# Patient Record
Sex: Female | Born: 1990 | Race: Black or African American | Hispanic: No | Marital: Single | Smoking: Former smoker
Health system: Southern US, Community
[De-identification: ages and names within clinical notes are randomized; demographics above are authoritative.]

## PROBLEM LIST (undated history)

## (undated) DIAGNOSIS — I10 Essential (primary) hypertension: Secondary | ICD-10-CM

## (undated) DIAGNOSIS — R7303 Prediabetes: Secondary | ICD-10-CM

## (undated) HISTORY — PX: FOOT SURGERY: SHX648

## (undated) HISTORY — PX: JOINT REPLACEMENT: SHX530

## (undated) HISTORY — DX: Essential (primary) hypertension: I10

## (undated) HISTORY — DX: Prediabetes: R73.03

---
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
## 2010-02-18 ENCOUNTER — Emergency Department: Payer: Self-pay | Admitting: Emergency Medicine

## 2010-06-16 ENCOUNTER — Ambulatory Visit: Payer: Self-pay | Admitting: Internal Medicine

## 2010-06-16 ENCOUNTER — Emergency Department: Payer: Self-pay | Admitting: Emergency Medicine

## 2010-06-18 ENCOUNTER — Emergency Department: Payer: Self-pay | Admitting: Emergency Medicine

## 2012-05-30 ENCOUNTER — Emergency Department: Payer: Self-pay | Admitting: Emergency Medicine

## 2012-05-30 LAB — URINALYSIS, COMPLETE
Bilirubin,UR: NEGATIVE
Blood: NEGATIVE
Glucose,UR: NEGATIVE mg/dL (ref 0–75)
Ph: 5 (ref 4.5–8.0)
RBC,UR: <1 /HPF (ref 0–5)
Specific Gravity: 1.03 (ref 1.003–1.030)
WBC UR: 2 /HPF (ref 0–5)

## 2012-11-22 ENCOUNTER — Emergency Department: Payer: Self-pay | Admitting: Emergency Medicine

## 2013-04-05 ENCOUNTER — Emergency Department: Payer: Self-pay | Admitting: Emergency Medicine

## 2013-04-05 LAB — CBC WITH DIFFERENTIAL/PLATELET
Basophil #: 0 10*3/uL (ref 0.0–0.1)
Basophil %: 0.2 %
Eosinophil #: 0 10*3/uL (ref 0.0–0.7)
Eosinophil %: 0 %
HCT: 38.4 % (ref 35.0–47.0)
HGB: 12.9 g/dL (ref 12.0–16.0)
LYMPHS ABS: 0.5 10*3/uL — AB (ref 1.0–3.6)
Lymphocyte %: 6.7 %
MCH: 27.2 pg (ref 26.0–34.0)
MCHC: 33.7 g/dL (ref 32.0–36.0)
MCV: 81 fL (ref 80–100)
Monocyte #: 0.2 x10 3/mm (ref 0.2–0.9)
Monocyte %: 2.9 %
NEUTROS ABS: 6.7 10*3/uL — AB (ref 1.4–6.5)
Neutrophil %: 90.2 %
PLATELETS: 215 10*3/uL (ref 150–440)
RBC: 4.76 10*6/uL (ref 3.80–5.20)
RDW: 15.6 % — AB (ref 11.5–14.5)
WBC: 7.5 10*3/uL (ref 3.6–11.0)

## 2013-04-05 LAB — COMPREHENSIVE METABOLIC PANEL
ALBUMIN: 4 g/dL (ref 3.4–5.0)
ALT: 23 U/L (ref 12–78)
Alkaline Phosphatase: 53 U/L
Anion Gap: 7 (ref 7–16)
BUN: 14 mg/dL (ref 7–18)
Bilirubin,Total: 0.4 mg/dL (ref 0.2–1.0)
CHLORIDE: 105 mmol/L (ref 98–107)
Calcium, Total: 9.4 mg/dL (ref 8.5–10.1)
Co2: 22 mmol/L (ref 21–32)
Creatinine: 0.54 mg/dL — ABNORMAL LOW (ref 0.60–1.30)
Glucose: 94 mg/dL (ref 65–99)
OSMOLALITY: 268 (ref 275–301)
Potassium: 3.8 mmol/L (ref 3.5–5.1)
SGOT(AST): 26 U/L (ref 15–37)
SODIUM: 134 mmol/L — AB (ref 136–145)
TOTAL PROTEIN: 8.5 g/dL — AB (ref 6.4–8.2)

## 2013-04-05 LAB — URINALYSIS, COMPLETE
BILIRUBIN, UR: NEGATIVE
BLOOD: NEGATIVE
Glucose,UR: NEGATIVE mg/dL (ref 0–75)
LEUKOCYTE ESTERASE: NEGATIVE
NITRITE: NEGATIVE
PH: 5 (ref 4.5–8.0)
PROTEIN: NEGATIVE
RBC,UR: 1 /HPF (ref 0–5)
Specific Gravity: 1.029 (ref 1.003–1.030)
Squamous Epithelial: 3
WBC UR: 3 /HPF (ref 0–5)

## 2013-04-05 LAB — LIPASE, BLOOD: LIPASE: 78 U/L (ref 73–393)

## 2013-04-05 LAB — PREGNANCY, URINE: PREGNANCY TEST, URINE: NEGATIVE m[IU]/mL

## 2014-07-01 ENCOUNTER — Emergency Department
Admit: 2014-07-01 | Disposition: A | Discharge status: Home / Self Care | Payer: Self-pay | Admitting: Emergency Medicine

## 2014-09-04 ENCOUNTER — Emergency Department
Admission: EM | Admit: 2014-09-04 | Discharge: 2014-09-04 | Disposition: A | Discharge status: Home / Self Care | Payer: Self-pay | Attending: Emergency Medicine | Admitting: Emergency Medicine

## 2014-09-04 ENCOUNTER — Encounter: Payer: Self-pay | Admitting: Emergency Medicine

## 2014-09-04 ENCOUNTER — Emergency Department
Admission: EM | Admit: 2014-09-04 | Discharge: 2014-09-04 | Discharge status: Against Medical Advice | Payer: Self-pay | Attending: Emergency Medicine | Admitting: Emergency Medicine

## 2014-09-04 DIAGNOSIS — Z720 Tobacco use: Secondary | ICD-10-CM | POA: Insufficient documentation

## 2014-09-04 DIAGNOSIS — M25531 Pain in right wrist: Secondary | ICD-10-CM | POA: Insufficient documentation

## 2014-09-04 DIAGNOSIS — M778 Other enthesopathies, not elsewhere classified: Secondary | ICD-10-CM | POA: Insufficient documentation

## 2014-09-04 MED ORDER — MELOXICAM 15 MG PO TABS: 15.0000 mg | ORAL_TABLET | Freq: Every day | ORAL | Status: DC

## 2014-09-04 NOTE — ED Notes (Signed)
Pt informed RN she has to be somewhere in 2 min. And has to leave.  Has not been seen by MD.

## 2014-09-04 NOTE — ED Notes (Signed)
Says right wrist pain that has worsened over last week.  No injury.  Hurts with movement.

## 2014-09-04 NOTE — ED Notes (Signed)
Pt seen in ER earlier today, left before being seen by provider.

## 2014-09-04 NOTE — Discharge Instructions (Signed)
Follow up with the orthopedic doctor for symptoms that are not improving over the week. Wear the brace while working. Return to the ER for symptoms that change or worsen if you are unable to schedule an appointment.

## 2014-09-04 NOTE — ED Provider Notes (Signed)
[facility] Emergency Department Provider Note ____________________________________________  Time seen: Approximately 6:33 PM  I have reviewed the triage vital signs and the nursing notes.   HISTORY  Chief Complaint Wrist Pain   HPI Kelly Spence is a 24 y.o. female who presents to the emergency department for right wrist pain. She states the pain has worsened over the past week. 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 She has not taken anything over-the-counter for the pain.    No past medical history on file.  There are no active problems to display for this patient.   No past surgical history on file.  Current Outpatient Rx  Name  Route  Sig  Dispense  Refill  . meloxicam (MOBIC) 15 MG tablet   Oral   Take 1 tablet (15 mg total) by mouth daily.   30 tablet   2     Allergies Chocolate and Nickel  No family history on file.  Social History History  Substance Use Topics  . Smoking status: Current Every Day Smoker  . Smokeless tobacco: Not on file  . Alcohol Use: No    Review of Systems Constitutional: No recent illness. Eyes: No visual changes. ENT: No sore throat. Cardiovascular: Denies chest pain or palpitations. Respiratory: Denies shortness of breath. Gastrointestinal: No abdominal pain.  Genitourinary: Negative for dysuria. Musculoskeletal: Pain in right wrist Skin: Negative for rash. Neurological: Negative for headaches, focal weakness or numbness. 10-point ROS otherwise negative.  ____________________________________________   PHYSICAL EXAM:  VITAL SIGNS: ED Triage Vitals  Enc Vitals Group     BP 09/04/14 1600 133/96 mmHg     Pulse Rate 09/04/14 1600 72     Resp --      Temp 09/04/14 1600 98.2 F (36.8 C)     Temp Source 09/04/14 1600 Oral     SpO2 09/04/14 1600 96 %     Weight 09/04/14 1600 237 lb  (107.502 kg)     Height 09/04/14 1600 5\' 8"  (1.727 m)     Head Cir --      Peak Flow --      Pain Score 09/04/14 1600 5     Pain Loc --      Pain Edu? --      Excl. in [place]? --     Constitutional: Alert and oriented. Well appearing and in no acute distress. Eyes: Conjunctivae are normal. EOMI. Head: Atraumatic. Nose: No congestion/rhinnorhea. Neck: No stridor.  Respiratory: Normal respiratory effort.   Musculoskeletal: Full range of motion of right wrist. Mild tenderness with palpation over the lateral right radius. No deformity. Neurologic:  Normal speech and language. No gross focal neurologic deficits are appreciated. Speech is normal. No gait instability. Skin:  Skin is warm, dry and intact. Atraumatic. Psychiatric: Mood and affect are normal. Speech and behavior are normal.  ____________________________________________   LABS (all labs ordered are listed, but only abnormal results are displayed)  Labs Reviewed - No data to display ____________________________________________  RADIOLOGY  Not indicated ____________________________________________   PROCEDURES  Procedure(s) performed: Velcro wrist splint applied by ER tech. Neurovascularly intact post-splint application.   ____________________________________________   INITIAL IMPRESSION / ASSESSMENT AND PLAN / ED COURSE  Pertinent labs & imaging results that were available during my care of the patient were reviewed by me and considered in my medical decision making (see chart for details).  Visit was advised to follow-up with orthopedics for symptoms that are not improving over the week. She was advised to return to the emergency department for symptoms that change or worsen if simple schedule an appointment. ____________________________________________   FINAL CLINICAL IMPRESSION(S) / ED DIAGNOSES  Final diagnoses:  Tendonitis of wrist, right      Victorino Dike, FNP 09/04/14 1853  Harvest Dark,  MD 09/04/14 2308

## 2014-09-04 NOTE — ED Notes (Signed)
Pt complains of right wrist pain, 5/10 pain, pt able to move wrist, pt states she believe it happened at work and hurts when she writes

## 2014-11-14 ENCOUNTER — Emergency Department
Admission: EM | Admit: 2014-11-14 | Discharge: 2014-11-14 | Disposition: A | Discharge status: Home / Self Care | Payer: Self-pay | Attending: Student | Admitting: Student

## 2014-11-14 ENCOUNTER — Encounter: Payer: Self-pay | Admitting: Emergency Medicine

## 2014-11-14 DIAGNOSIS — G44009 Cluster headache syndrome, unspecified, not intractable: Secondary | ICD-10-CM | POA: Insufficient documentation

## 2014-11-14 DIAGNOSIS — Z720 Tobacco use: Secondary | ICD-10-CM | POA: Insufficient documentation

## 2014-11-14 MED ORDER — METOCLOPRAMIDE HCL 10 MG PO TABS
10.0000 mg | ORAL_TABLET | Freq: Once | ORAL | Status: AC
  Administered 2014-11-14: 10 mg via ORAL
  Filled 2014-11-14: qty 1

## 2014-11-14 MED ORDER — NAPROXEN 500 MG PO TBEC: 500.0000 mg | DELAYED_RELEASE_TABLET | Freq: Two times a day (BID) | ORAL | Status: DC

## 2014-11-14 MED ORDER — DIPHENHYDRAMINE HCL 50 MG PO CAPS
50.0000 mg | ORAL_CAPSULE | Freq: Once | ORAL | Status: AC
  Administered 2014-11-14: 50 mg via ORAL
  Filled 2014-11-14: qty 1

## 2014-11-14 MED ORDER — NAPROXEN 500 MG PO TABS
500.0000 mg | ORAL_TABLET | Freq: Once | ORAL | Status: AC
  Administered 2014-11-14: 500 mg via ORAL
  Filled 2014-11-14: qty 1

## 2014-11-14 MED ORDER — CYCLOBENZAPRINE HCL 5 MG PO TABS: 5.0000 mg | ORAL_TABLET | Freq: Three times a day (TID) | ORAL | Status: DC | PRN

## 2014-11-14 NOTE — ED Notes (Signed)
Patient reports headache since last night. Denies any sinus issues. Patient in no acute distress.

## 2014-11-14 NOTE — Discharge Instructions (Signed)
General Headache Without Cause A headache is pain or discomfort felt around the head or neck area. The specific cause of a headache may not be found. There are many causes and types of headaches. A few common ones are:  Tension headaches.  Migraine headaches.  Cluster headaches.  Chronic daily headaches. HOME CARE INSTRUCTIONS   Keep all follow-up appointments with your caregiver or any specialist referral.  Only take over-the-counter or prescription medicines for pain or discomfort as directed by your caregiver.  Lie down in a dark, quiet room when you have a headache.  Keep a headache journal to find out what may trigger your migraine headaches. For example, write down:  What you eat and drink.  How much sleep you get.  Any change to your diet or medicines.  Try massage or other relaxation techniques.  Put ice packs or heat on the head and neck. Use these 3 to 4 times per day for 15 to 20 minutes each time, or as needed.  Limit stress.  Sit up straight, and do not tense your muscles.  Quit smoking if you smoke.  Limit alcohol use.  Decrease the amount of caffeine you drink, or stop drinking caffeine.  Eat and sleep on a regular schedule.  Get 7 to 9 hours of sleep, or as recommended by your caregiver.  Keep lights dim if bright lights bother you and make your headaches worse. SEEK MEDICAL CARE IF:   You have problems with the medicines you were prescribed.  Your medicines are not working.  You have a change from the usual headache.  You have nausea or vomiting. SEEK IMMEDIATE MEDICAL CARE IF:   Your headache becomes severe.  You have a fever.  You have a stiff neck.  You have loss of vision.  You have muscular weakness or loss of muscle control.  You start losing your balance or have trouble walking.  You feel faint or pass out.  You have severe symptoms that are different from your first symptoms. MAKE SURE YOU:   Understand these  instructions.  Will watch your condition.  Will get help right away if you are not doing well or get worse. Document Released: 03/08/2005 Document Revised: 05/31/2011 Document Reviewed: 03/24/2011 [facility] Patient Information 2015 [place], [place]. This information is not intended to replace advice given to you by your health care provider. Make sure you discuss any questions you have with your health care provider.  Take the prescription meds as directed. Follow-up with [facility] Department as needed.

## 2014-11-14 NOTE — ED Notes (Signed)
Pt states HA started last night goes from top of the head to back. Unable to get much sleep last night with HA.

## 2014-11-14 NOTE — ED Provider Notes (Signed)
[facility] Emergency Department Provider Note ____________________________________________  Time seen: 1750  I have reviewed the triage vital signs and the nursing notes.  HISTORY  Chief Complaint  Headache  HPI Kelly Spence is a 24 y.o. female reports to the ED for evaluation and management of her complaint of headache. She describes onset yesterday, while at work. She notes that the headache is across the top of her head and she describes it as tightness. She denies any nausea, vomiting, dizziness, vertigo, or syncope. She dosed any "p.m." type medicine last night at about 7 PM without significant benefit. She reports a pain of to her headache as a 6/10 in triage. She denies any injuries at this time.  History reviewed. No pertinent past medical history.  There are no active problems to display for this patient.  History reviewed. No pertinent past surgical history.  Current Outpatient Rx  Name  Route  Sig  Dispense  Refill  . cyclobenzaprine (FLEXERIL) 5 MG tablet   Oral   Take 1 tablet (5 mg total) by mouth every 8 (eight) hours as needed for muscle spasms.   12 tablet   0   . naproxen (EC NAPROSYN) 500 MG EC tablet   Oral   Take 1 tablet (500 mg total) by mouth 2 (two) times daily with a meal.   30 tablet   0    Allergies Chocolate and Nickel  No family history on file.  Social History Social History  Substance Use Topics  . Smoking status: Current Every Day Smoker  . Smokeless tobacco: None  . Alcohol Use: No   Review of Systems  Constitutional: Negative for fever. Eyes: Negative for visual changes. ENT: Negative for sore throat. Cardiovascular: Negative for chest pain. Respiratory: Negative for shortness of breath. Gastrointestinal: Negative for abdominal pain, vomiting and diarrhea. Genitourinary: Negative for dysuria. Musculoskeletal: Negative for back pain. Skin: Negative for rash. Neurological: Negative for focal  weakness or numbness. Reports headache ____________________________________________  PHYSICAL EXAM:  VITAL SIGNS: ED Triage Vitals  Enc Vitals Group     BP 11/14/14 1734 141/74 mmHg     Pulse Rate 11/14/14 1734 102     Resp 11/14/14 1734 18     Temp 11/14/14 1734 99.5 F (37.5 C)     Temp src --      SpO2 11/14/14 1734 100 %     Weight 11/14/14 1734 238 lb (107.956 kg)     Height 11/14/14 1734 5\' 11"  (1.803 m)     Head Cir --      Peak Flow --      Pain Score 11/14/14 1735 6     Pain Loc --      Pain Edu? --      Excl. in [place]? --    Constitutional: Alert and oriented. Well appearing and in no distress. Eyes: Conjunctivae are normal. PERRL. Normal extraocular movements. Ears: canals clear. TMs intact without bulge, injection, or rupture.    Head: Normocephalic and atraumatic.   Nose: No congestion/rhinnorhea.   Mouth/Throat: Mucous membranes are moist.   Neck: Supple. No thyromegaly. Hematological/Lymphatic/Immunilogical: No cervical lymphadenopathy. Cardiovascular: Normal rate, regular rhythm.  Respiratory: Normal respiratory effort. No wheezes/rales/rhonchi. Gastrointestinal: Soft and nontender. No distention. Musculoskeletal: Nontender with normal range of motion in all extremities.  Neurologic: CN II-XII grossly intact. Normal gait without ataxia. Normal speech and language. No gross focal neurologic deficits are appreciated. Skin:  Skin is warm, dry and intact. No rash noted. Psychiatric:  Mood and affect are normal. Patient exhibits appropriate insight and judgment. ____________________________________________  PROCEDURES  Naprosyn 500 mg PO Reglan 10 mg Po Benadryl 50 mg PO ____________________________________________  INITIAL IMPRESSION / ASSESSMENT AND PLAN / ED COURSE  General headache, not intractable. Consider cluster headache presentation. Will treat with EC Naprosyn and Flexeril.  Follow-up with [facility] Department as needed.   ____________________________________________  FINAL CLINICAL IMPRESSION(S) / ED DIAGNOSES  Final diagnoses:  Cluster headache, not intractable, unspecified chronicity pattern     Melvenia Needles, PA-C 11/14/14 1856  Joanne Gavel, MD 11/15/14 [id number]

## 2015-06-18 LAB — HM PAP SMEAR: HM Pap smear: NEGATIVE

## 2015-11-07 ENCOUNTER — Emergency Department
Admission: EM | Admit: 2015-11-07 | Discharge: 2015-11-07 | Disposition: A | Discharge status: Home / Self Care | Payer: Self-pay | Attending: Emergency Medicine | Admitting: Emergency Medicine

## 2015-11-07 DIAGNOSIS — D1720 Benign lipomatous neoplasm of skin and subcutaneous tissue of unspecified limb: Secondary | ICD-10-CM

## 2015-11-07 DIAGNOSIS — D1779 Benign lipomatous neoplasm of other sites: Secondary | ICD-10-CM | POA: Insufficient documentation

## 2015-11-07 DIAGNOSIS — F17210 Nicotine dependence, cigarettes, uncomplicated: Secondary | ICD-10-CM | POA: Insufficient documentation

## 2015-11-07 NOTE — ED Provider Notes (Signed)
[facility] Emergency Department Provider Note  ____________________________________________  Time seen: Approximately 4:48 PM  I have reviewed the triage vital signs and the nursing notes.   HISTORY  Chief Complaint Abscess (lump underskin posterior left shoulder)    HPI Kelly Spence is a 25 y.o. female who presents emergency department complaining of a "knot" to the left shoulder. Patient states that yesterday her bra strap was pressing on an area of her shoulder that was causing her some pain. When she palpated the area she felt a "knot". Patient was concerned that she may have an abscess to her shoulder. Patient denies any visible swelling, redness, drainage from site. No other complaint at this time. No medications prior to arrival.   History reviewed. No pertinent past medical history.  There are no active problems to display for this patient.   Past Surgical History:  Procedure Laterality Date  . JOINT REPLACEMENT     5th toe, left foot    Prior to Admission medications   Medication Sig Start Date End Date Taking? Authorizing Provider  cyclobenzaprine (FLEXERIL) 5 MG tablet Take 1 tablet (5 mg total) by mouth every 8 (eight) hours as needed for muscle spasms. 11/14/14   Jenise V Bacon Menshew, PA-C  naproxen (EC NAPROSYN) 500 MG EC tablet Take 1 tablet (500 mg total) by mouth 2 (two) times daily with a meal. 11/14/14   Jenise V Bacon Menshew, PA-C    Allergies Chocolate and Nickel  Family History  Problem Relation Age of Onset  . Hypertension Mother   . Hypertension Father   . Seizures Cousin   . Cancer Paternal Grandmother   . Diabetes Maternal Grandmother   . Cancer Paternal Uncle     Social History Social History  Substance Use Topics  . Smoking status: Current Every Day Smoker    Packs/day: 0.50    Types: Cigarettes  . Smokeless tobacco: Never Used  . Alcohol use No     Review of Systems  Constitutional: No  fever/chills Cardiovascular: no chest pain. Respiratory: no cough. No SOB. Gastrointestinal: No abdominal pain.  No nausea, no vomiting.   Musculoskeletal: Negative for musculoskeletal pain. Skin:Positive for "knot" to left shoulder Neurological: Negative for headaches, focal weakness or numbness. 10-point ROS otherwise negative.  ____________________________________________   PHYSICAL EXAM:  VITAL SIGNS: ED Triage Vitals  Enc Vitals Group     BP 11/07/15 1640 130/72     Pulse Rate 11/07/15 1640 88     Resp 11/07/15 1640 18     Temp 11/07/15 1640 98.2 F (36.8 C)     Temp Source 11/07/15 1640 Oral     SpO2 11/07/15 1640 98 %     Weight 11/07/15 1635 235 lb (106.6 kg)     Height 11/07/15 1635 5\' 10"  (1.778 m)     Head Circumference --      Peak Flow --      Pain Score --      Pain Loc --      Pain Edu? --      Excl. in [place]? --      Constitutional: Alert and oriented. Well appearing and in no acute distress. Eyes: Conjunctivae are normal. PERRL. EOMI. Head: Atraumatic. Neck: No stridor.  No cervical spine tenderness to palpation. Hematological/Lymphatic/Immunilogical: No cervical lymphadenopathy. Cardiovascular: Normal rate, regular rhythm. Normal S1 and S2.  Good peripheral circulation. Respiratory: Normal respiratory effort without tachypnea or retractions. Lungs CTAB. Good air entry to the bases with no  decreased or absent breath sounds. Musculoskeletal: Full range of motion to all extremities. No gross deformities appreciated. Neurologic:  Normal speech and language. No gross focal neurologic deficits are appreciated.  Skin:  Skin is warm, dry and intact. No rash noted.Well-circumscribed, mobile, lesion is appreciated by palpation in the subcutaneous layer of the skin. Area is mildly tender palpation. No overlying erythema or edema. No drainage. No fluctuance. No palpable cellulitis. Psychiatric: Mood and affect are normal. Speech and behavior are normal. Patient  exhibits appropriate insight and judgement.   ____________________________________________   LABS (all labs ordered are listed, but only abnormal results are displayed)  Labs Reviewed - No data to display ____________________________________________  EKG   ____________________________________________  RADIOLOGY   No results found.  ____________________________________________    PROCEDURES  Procedure(s) performed:    Procedures    Medications - No data to display   ____________________________________________   INITIAL IMPRESSION / ASSESSMENT AND PLAN / ED COURSE  Pertinent labs & imaging results that were available during my care of the patient were reviewed by me and considered in my medical decision making (see chart for details).  Clinical Course    Patient's diagnosis is consistent with Lipoma to the left shoulder. Exam is reassuring with a well-circumscribed, mobile lesion in the subcutaneous tissue.Marland Kitchen No indication for imaging at this time. No signs of infection.. Patient to take Tylenol and Motrin as needed for symptom relief. Patient will follow-up with surgeon if area continues to be asymptomatic. Patient is given ED precautions to return to the ED for any worsening or new symptoms.     ____________________________________________  FINAL CLINICAL IMPRESSION(S) / ED DIAGNOSES  Final diagnoses:  Lipoma of shoulder      NEW MEDICATIONS STARTED DURING THIS VISIT:  New Prescriptions   No medications on file        This chart was dictated using voice recognition software/Dragon. Despite best efforts to proofread, errors can occur which can change the meaning. Any change was purely unintentional.    Darletta Moll, PA-C 11/07/15 1703    Harvest Dark, MD 11/08/15 [phone]

## 2015-11-07 NOTE — ED Triage Notes (Signed)
PT c/o possible abscess to posterior left shoulder. Pt has a palpable mass to posterior left shoulder. Area is tender to touch. No drainage/warmth/redness noted on exam

## 2015-11-07 NOTE — ED Notes (Signed)
Reviewed d/c instructions, follow-up care with pt. Pt verbalized understanding 

## 2016-12-10 ENCOUNTER — Emergency Department
Admission: EM | Admit: 2016-12-10 | Discharge: 2016-12-10 | Disposition: A | Discharge status: Home / Self Care | Payer: No Typology Code available for payment source | Attending: Student in an Organized Health Care Education/Training Program | Admitting: Student in an Organized Health Care Education/Training Program

## 2016-12-10 ENCOUNTER — Encounter: Payer: Self-pay | Admitting: Emergency Medicine

## 2016-12-10 DIAGNOSIS — F17210 Nicotine dependence, cigarettes, uncomplicated: Secondary | ICD-10-CM | POA: Insufficient documentation

## 2016-12-10 DIAGNOSIS — Y92410 Unspecified street and highway as the place of occurrence of the external cause: Secondary | ICD-10-CM | POA: Diagnosis not present

## 2016-12-10 DIAGNOSIS — S199XXA Unspecified injury of neck, initial encounter: Secondary | ICD-10-CM | POA: Diagnosis present

## 2016-12-10 DIAGNOSIS — Y939 Activity, unspecified: Secondary | ICD-10-CM | POA: Insufficient documentation

## 2016-12-10 DIAGNOSIS — S161XXA Strain of muscle, fascia and tendon at neck level, initial encounter: Secondary | ICD-10-CM | POA: Diagnosis not present

## 2016-12-10 DIAGNOSIS — S80811A Abrasion, right lower leg, initial encounter: Secondary | ICD-10-CM | POA: Insufficient documentation

## 2016-12-10 DIAGNOSIS — Z79899 Other long term (current) drug therapy: Secondary | ICD-10-CM | POA: Diagnosis not present

## 2016-12-10 DIAGNOSIS — Y999 Unspecified external cause status: Secondary | ICD-10-CM | POA: Insufficient documentation

## 2016-12-10 MED ORDER — IBUPROFEN 600 MG PO TABS: 600.0000 mg | ORAL_TABLET | Freq: Four times a day (QID) | ORAL | 0 refills | Status: DC | PRN

## 2016-12-10 MED ORDER — BACITRACIN ZINC 500 UNIT/GM EX OINT
TOPICAL_OINTMENT | CUTANEOUS | Status: AC
  Filled 2016-12-10: qty 0.9

## 2016-12-10 MED ORDER — CYCLOBENZAPRINE HCL 5 MG PO TABS: 5.0000 mg | ORAL_TABLET | Freq: Three times a day (TID) | ORAL | 0 refills | Status: DC | PRN

## 2016-12-10 MED ORDER — BACITRACIN ZINC 500 UNIT/GM EX OINT
TOPICAL_OINTMENT | Freq: Two times a day (BID) | CUTANEOUS | Status: DC
  Administered 2016-12-10: 19:00:00 via TOPICAL
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
## 2016-12-14 ENCOUNTER — Emergency Department
Admission: EM | Admit: 2016-12-14 | Discharge: 2016-12-14 | Disposition: A | Discharge status: Home / Self Care | Payer: No Typology Code available for payment source | Attending: Emergency Medicine | Admitting: Emergency Medicine

## 2016-12-14 ENCOUNTER — Encounter: Payer: Self-pay | Admitting: Emergency Medicine

## 2016-12-14 DIAGNOSIS — S161XXD Strain of muscle, fascia and tendon at neck level, subsequent encounter: Secondary | ICD-10-CM | POA: Diagnosis not present

## 2016-12-14 DIAGNOSIS — F17210 Nicotine dependence, cigarettes, uncomplicated: Secondary | ICD-10-CM | POA: Insufficient documentation

## 2016-12-14 DIAGNOSIS — Z791 Long term (current) use of non-steroidal anti-inflammatories (NSAID): Secondary | ICD-10-CM | POA: Diagnosis not present

## 2016-12-14 DIAGNOSIS — S199XXD Unspecified injury of neck, subsequent encounter: Secondary | ICD-10-CM | POA: Diagnosis present

## 2016-12-14 MED ORDER — IBUPROFEN 800 MG PO TABS: 800.0000 mg | ORAL_TABLET | Freq: Three times a day (TID) | ORAL | 0 refills | Status: DC | PRN

## 2016-12-14 MED ORDER — METHOCARBAMOL 750 MG PO TABS
750.0000 mg | ORAL_TABLET | Freq: Four times a day (QID) | ORAL | 0 refills | Status: DC
Start: 2016-12-14 — End: 2020-06-20
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
## 2016-12-14 NOTE — ED Notes (Signed)
Pt walked from waiting room to flex room with no issues noted

## 2017-03-01 DIAGNOSIS — R030 Elevated blood-pressure reading, without diagnosis of hypertension: Secondary | ICD-10-CM | POA: Insufficient documentation

## 2017-05-18 ENCOUNTER — Encounter: Payer: Self-pay | Admitting: Emergency Medicine

## 2017-05-18 ENCOUNTER — Emergency Department
Admission: EM | Admit: 2017-05-18 | Discharge: 2017-05-18 | Disposition: A | Discharge status: Home / Self Care | Payer: Self-pay | Attending: Student in an Organized Health Care Education/Training Program | Admitting: Student in an Organized Health Care Education/Training Program

## 2017-05-18 DIAGNOSIS — Z79899 Other long term (current) drug therapy: Secondary | ICD-10-CM | POA: Insufficient documentation

## 2017-05-18 DIAGNOSIS — M549 Dorsalgia, unspecified: Secondary | ICD-10-CM | POA: Insufficient documentation

## 2017-05-18 DIAGNOSIS — F17210 Nicotine dependence, cigarettes, uncomplicated: Secondary | ICD-10-CM | POA: Insufficient documentation

## 2017-05-18 LAB — URINALYSIS, COMPLETE (UACMP) WITH MICROSCOPIC
Bilirubin Urine: NEGATIVE
Glucose, UA: NEGATIVE mg/dL
Hgb urine dipstick: NEGATIVE
Ketones, ur: NEGATIVE mg/dL
Leukocytes, UA: NEGATIVE
Nitrite: NEGATIVE
PROTEIN: NEGATIVE mg/dL
SPECIFIC GRAVITY, URINE: 1.02 (ref 1.005–1.030)
pH: 5 (ref 5.0–8.0)

## 2017-05-18 MED ORDER — BACLOFEN 10 MG PO TABS: 10.0000 mg | ORAL_TABLET | Freq: Every day | ORAL | 1 refills | Status: AC

## 2017-05-18 MED ORDER — MELOXICAM 15 MG PO TABS: 15.0000 mg | ORAL_TABLET | Freq: Every day | ORAL | 2 refills | Status: AC

## 2017-05-18 NOTE — ED Provider Notes (Signed)
[facility] Emergency Department Provider Note  ____________________________________________   First MD Initiated Contact with Patient 05/18/17 1655     (approximate)  I have reviewed the triage vital signs and the nursing notes.   HISTORY  Chief Complaint Back Pain    HPI Kelly Spence is a 27 y.o. female presents emergency department complaining of upper mid back pain.  Radiates into the ribs.  States it is hurting deep within.  Does not hurt to touch.  Does not hurt with movement.  She denies any fever, chills, vomiting, diarrhea, or urinary symptoms.  She denies any injury  History reviewed. No pertinent past medical history.  There are no active problems to display for this patient.   Past Surgical History:  Procedure Laterality Date  . JOINT REPLACEMENT     5th toe, left foot    Prior to Admission medications   Medication Sig Start Date End Date Taking? Authorizing Provider  baclofen (LIORESAL) 10 MG tablet Take 1 tablet (10 mg total) by mouth daily. 05/18/17 05/18/18  Cassie Shedlock, Linden Dolin, PA-C  cyclobenzaprine (FLEXERIL) 5 MG tablet Take 1-2 tablets (5-10 mg total) by mouth 3 (three) times daily as needed for muscle spasms. 12/10/16   Duanne Guess, PA-C  ibuprofen (ADVIL,MOTRIN) 800 MG tablet Take 1 tablet (800 mg total) by mouth every 8 (eight) hours as needed for moderate pain. 12/14/16   Sable Feil, PA-C  ibuprofen (IBU) 600 MG tablet Take 1 tablet (600 mg total) by mouth every 6 (six) hours as needed. 12/10/16   Duanne Guess, PA-C  meloxicam (MOBIC) 15 MG tablet Take 1 tablet (15 mg total) by mouth daily. 05/18/17 05/18/18  Caryn Section, Linden Dolin, PA-C  methocarbamol (ROBAXIN-750) 750 MG tablet Take 1 tablet (750 mg total) by mouth 4 (four) times daily. 12/14/16   Sable Feil, PA-C  naproxen (EC NAPROSYN) 500 MG EC tablet Take 1 tablet (500 mg total) by mouth 2 (two) times daily with a meal. 11/14/14   Menshew, Dannielle Karvonen, PA-C     Allergies Chocolate and Nickel  Family History  Problem Relation Age of Onset  . Hypertension Mother   . Hypertension Father   . Seizures Cousin   . Cancer Paternal Grandmother   . Diabetes Maternal Grandmother   . Cancer Paternal Uncle     Social History Social History   Tobacco Use  . Smoking status: Current Every Day Smoker    Packs/day: 0.50    Types: Cigarettes  . Smokeless tobacco: Never Used  Substance Use Topics  . Alcohol use: No  . Drug use: No    Review of Systems  Constitutional: No fever/chills Eyes: No visual changes. ENT: No sore throat. Respiratory: Denies cough Genitourinary: Negative for dysuria. Musculoskeletal: Positive for back pain. Skin: Negative for rash.    ____________________________________________   PHYSICAL EXAM:  VITAL SIGNS: ED Triage Vitals  Enc Vitals Group     BP 05/18/17 1653 (!) 148/93     Pulse Rate 05/18/17 1653 76     Resp 05/18/17 1653 17     Temp 05/18/17 1653 98.8 F (37.1 C)     Temp Source 05/18/17 1653 Oral     SpO2 05/18/17 1653 100 %     Weight 05/18/17 1652 220 lb (99.8 kg)     Height 05/18/17 1652 5\' 10"  (1.778 m)     Head Circumference --      Peak Flow --  Pain Score 05/18/17 1651 6     Pain Loc --      Pain Edu? --      Excl. in [place]? --     Constitutional: Alert and oriented. Well appearing and in no acute distress. Eyes: Conjunctivae are normal.  Head: Atraumatic. Nose: No congestion/rhinnorhea. Mouth/Throat: Mucous membranes are moist.   Cardiovascular: Normal rate, regular rhythm.  Heart sounds are normal Respiratory: Normal respiratory effort.  No retractions, lungs are clear to auscultation GU: deferred Musculoskeletal: FROM all extremities, warm and well perfused, ribs in the back are nontender Neurologic:  Normal speech and language.  Skin:  Skin is warm, dry and intact. No rash noted. Psychiatric: Mood and affect are normal. Speech and behavior are  normal.  ____________________________________________   LABS (all labs ordered are listed, but only abnormal results are displayed)  Labs Reviewed  URINALYSIS, COMPLETE (UACMP) WITH MICROSCOPIC - Abnormal; Notable for the following components:      Result Value   Color, Urine YELLOW (*)    APPearance CLOUDY (*)    Bacteria, UA RARE (*)    Squamous Epithelial / LPF 6-30 (*)    All other components within normal limits   ____________________________________________   ____________________________________________  RADIOLOGY    ____________________________________________   PROCEDURES  Procedure(s) performed: No  Procedures    ____________________________________________   INITIAL IMPRESSION / ASSESSMENT AND PLAN / ED COURSE  Pertinent labs & imaging results that were available during my care of the patient were reviewed by me and considered in my medical decision making (see chart for details).  Patient's 27 year old female complaining of mid back pain.  She denies injury.  Physical exam the area is nontender.  Remainder the exam is benign  UA is normal  Diagnosis is mid back pain.  She was given a prescription for meloxicam 15 mg and baclofen 10 mg 3 times daily.  She is to apply ice to the area.  If she is worsening she is to return the emergency department or see her regular doctor.  She states she understands to comply with instructions.  She was discharged in stable condition     As part of my medical decision making, I reviewed the following data within the [place] notes reviewed and incorporated, Labs reviewed UA is normal, Notes from prior ED visits and [place] Controlled Substance Database  ____________________________________________   FINAL CLINICAL IMPRESSION(S) / ED DIAGNOSES  Final diagnoses:  Mid-back pain, acute      NEW MEDICATIONS STARTED DURING THIS VISIT:  New Prescriptions   BACLOFEN (LIORESAL) 10 MG TABLET     Take 1 tablet (10 mg total) by mouth daily.   MELOXICAM (MOBIC) 15 MG TABLET    Take 1 tablet (15 mg total) by mouth daily.     Note:  This document was prepared using Dragon voice recognition software and may include unintentional dictation errors.    Versie Starks, PA-C 05/18/17 1751    Merlyn Lot, MD 05/18/17 [phone]
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
## 2017-05-18 NOTE — Discharge Instructions (Signed)
Follow-up with your regular doctor if not better in 3-5 days.  Return to emergency department for worsening.  Take medication as prescribed.  Your urinalysis was normal today.  Apply ice to the area that hurts.  This will help with spasms and inflammation.

## 2017-05-18 NOTE — ED Notes (Signed)
Pain over right lateral ribs. Not worse with anything, "it is just there".  No urinary sx.  No fever or cough.

## 2018-03-31 DIAGNOSIS — E669 Obesity, unspecified: Secondary | ICD-10-CM | POA: Insufficient documentation

## 2018-11-01 ENCOUNTER — Emergency Department
Admission: EM | Admit: 2018-11-01 | Discharge: 2018-11-01 | Disposition: A | Discharge status: Home / Self Care | Payer: Self-pay | Attending: Emergency Medicine | Admitting: Emergency Medicine

## 2018-11-01 ENCOUNTER — Other Ambulatory Visit: Payer: Self-pay

## 2018-11-01 ENCOUNTER — Encounter: Payer: Self-pay | Admitting: *Deleted

## 2018-11-01 DIAGNOSIS — I10 Essential (primary) hypertension: Secondary | ICD-10-CM | POA: Insufficient documentation

## 2018-11-01 DIAGNOSIS — Z5321 Procedure and treatment not carried out due to patient leaving prior to being seen by health care provider: Secondary | ICD-10-CM | POA: Insufficient documentation

## 2018-11-01 LAB — BASIC METABOLIC PANEL
Anion gap: 9 (ref 5–15)
BUN: 16 mg/dL (ref 6–20)
CO2: 26 mmol/L (ref 22–32)
Calcium: 9.4 mg/dL (ref 8.9–10.3)
Chloride: 102 mmol/L (ref 98–111)
Creatinine, Ser: 0.69 mg/dL (ref 0.44–1.00)
GFR calc Af Amer: >60 mL/min (ref >60)
GFR calc non Af Amer: >60 mL/min (ref >60)
Glucose, Bld: 81 mg/dL (ref 70–99)
Potassium: 4 mmol/L (ref 3.5–5.1)
Sodium: 137 mmol/L (ref 135–145)

## 2018-11-01 LAB — CBC
HCT: 40.2 % (ref 36.0–46.0)
Hemoglobin: 13.2 g/dL (ref 12.0–15.0)
MCH: 28.3 pg (ref 26.0–34.0)
MCHC: 32.8 g/dL (ref 30.0–36.0)
MCV: 86.1 fL (ref 80.0–100.0)
Platelets: 210 10*3/uL (ref 150–400)
RBC: 4.67 MIL/uL (ref 3.87–5.11)
RDW: 14.6 % (ref 11.5–15.5)
WBC: 7 10*3/uL (ref 4.0–10.5)
nRBC: 0 % (ref 0.0–0.2)

## 2018-11-01 NOTE — ED Triage Notes (Signed)
Pt ambulatory to triage.  Pt reports htn.  Recent headaches and high blood pressure.  No hx of htn.  Pt alert   Speech clear.

## 2018-11-01 NOTE — ED Notes (Signed)
Pt to STAT desk asking about time and lab results. Pt informed that results have come back but there are still several people ahead of her. Pt states she has somewhere to be in 30 min and is unable to stay. pleasant and verbalizes understanding about risks of leaving.

## 2018-11-03 ENCOUNTER — Telehealth: Payer: Self-pay | Admitting: Emergency Medicine

## 2018-11-03 NOTE — Telephone Encounter (Signed)
Called patient due to lwot to inquire about condition and follow up plans.  No answer and no voicemail  

## 2018-11-30 DIAGNOSIS — R030 Elevated blood-pressure reading, without diagnosis of hypertension: Secondary | ICD-10-CM

## 2018-11-30 DIAGNOSIS — E669 Obesity, unspecified: Secondary | ICD-10-CM

## 2018-12-01 ENCOUNTER — Encounter: Payer: Self-pay | Admitting: Family Medicine

## 2018-12-01 ENCOUNTER — Other Ambulatory Visit: Payer: Self-pay

## 2018-12-01 ENCOUNTER — Ambulatory Visit (LOCAL_COMMUNITY_HEALTH_CENTER): Payer: Self-pay | Admitting: Family Medicine

## 2018-12-01 VITALS — BP 151/101 | Ht 70.0 in | Wt 225.8 lb

## 2018-12-01 DIAGNOSIS — Z30013 Encounter for initial prescription of injectable contraceptive: Secondary | ICD-10-CM

## 2018-12-01 DIAGNOSIS — Z720 Tobacco use: Secondary | ICD-10-CM

## 2018-12-01 DIAGNOSIS — Z3009 Encounter for other general counseling and advice on contraception: Secondary | ICD-10-CM

## 2018-12-01 MED ORDER — MEDROXYPROGESTERONE ACETATE 150 MG/ML IM SUSP
150.0000 mg | INTRAMUSCULAR | Status: AC
  Administered 2018-12-01: 150 mg via INTRAMUSCULAR

## 2018-12-01 NOTE — Progress Notes (Signed)
Depo given per Ernestina Patches; tolerated well Aileen Fass, RN

## 2018-12-01 NOTE — Progress Notes (Signed)
Here today to restart Depo. BP initially taken in left arm and reading documented in vitals. Retaken in right arm with a large cuff and found to be 144/97. Hal Morales, RN

## 2018-12-01 NOTE — Progress Notes (Signed)
Family Planning Visit- Repeat Yearly Visit  Subjective:  Kelly Spence is a 28 y.o. being seen today for an well woman visit and to discuss family planning options.    She is currently using none for pregnancy prevention. Patient reports she does not want a pregnancy in the next year. Patient  has Obesity, unspecified; Elevated blood pressure reading without diagnosis of hypertension; and Tobacco use on their problem list.  Chief Complaint  Patient presents with  . Contraception    Depo    Patient reports no changes in history since last visit.  LMP 11/30/2018 and not pregnant.safe to give depo. Missed depo in March due [place]  ER visit  On 11/01/2018 was for HTN and reports BP was 160-190/100. [facility]. They were going to see her via telehealth but she was unsure about this.    Does the patient desire a pregnancy in the next year? (OK[facility] flowsheet)  See flowsheet for other program required questions.   Body mass index is 32.4 kg/m. - Patient is eligible for diabetes screening based on BMI and age >32?  no HA1C ordered? not applicable  Patient reports 1 of partners in last year. Desires STI screening?  No - declines screening  Does the patient have a current or past history of drug use? No   No components found for: HCV]   Health Maintenance Due  Topic Date Due  . HIV Screening  10/01/2005  . TETANUS/TDAP  10/01/2009  . PAP-Cervical Cytology Screening  10/02/2011  . PAP SMEAR-Modifier  10/02/2011  . INFLUENZA VACCINE  10/21/2018    Review of Systems  Constitutional: Negative for chills and fever.  Eyes: Negative for blurred vision and double vision.  Respiratory: Negative for cough and shortness of breath.   Cardiovascular: Negative for chest pain and orthopnea.  Gastrointestinal: Negative for nausea and vomiting.  Genitourinary: Negative for dysuria, flank pain and frequency.  Musculoskeletal: Negative for myalgias.  Skin: Negative for rash.   Neurological: Negative for dizziness, tingling, weakness and headaches.  Endo/Heme/Allergies: Does not bruise/bleed easily.  Psychiatric/Behavioral: Negative for depression and suicidal ideas. The patient is not nervous/anxious.     The following portions of the patient's history were reviewed and updated as appropriate: allergies, current medications, past family history, past medical history, past social history, past surgical history and problem list. Problem list updated.  Objective:   Vitals:   12/01/18 1437  BP: (!) 151/101  Weight: 225 lb 12.8 oz (102.4 kg)  Height: 5\' 10"  (1.778 m)    Physical Exam Vitals signs and nursing note reviewed.  Constitutional:      Appearance: Normal appearance.  HENT:     Head: Normocephalic.  Pulmonary:     Effort: Pulmonary effort is normal.  Musculoskeletal: Normal range of motion.  Skin:    General: Skin is warm and dry.  Neurological:     Mental Status: She is alert. Mental status is at baseline.    Assessment and Plan:  Kelly Spence is a 28 y.o. female presenting to the [facility] Department for an initial well woman exam/family planning visit  #Annual/HCM patient is due for a pap smear based on last pap in 2017. Recommend she make an appt for depo and pap only in 3 months.   #Contraception counseling: Reviewed all forms of birth control options available including abstinence; over the counter/barrier methods; hormonal contraceptive medication including pill, patch, ring, injection,contraceptive implant; hormonal and nonhormonal IUDs; permanent sterilization options including vasectomy and  the various tubal sterilization modalities. Risks and benefits reviewed.  Questions were answered.  Written information was also given to the patient to review.  Patient desires Depo, this was prescribed for patient. She will follow up in 1 yr for surveillance.  She was told to call with any further questions, or with any concerns  about this method of contraception.  Emphasized use of condoms 100% of the time for STI prevention.  #HTN: recommended she follow up with [facility] to establish PCP care and evaluate blood pressure. Patient without sx today but elevated BP. Discussed that depo is safe with HTN but she needs evaluation.   Return in about 3 months (around 03/02/2019) for Depo with pap only.  No future appointments.  Caren Macadam, MD

## 2019-02-14 ENCOUNTER — Ambulatory Visit: Payer: Self-pay

## 2019-03-05 ENCOUNTER — Ambulatory Visit: Payer: Self-pay

## 2019-03-07 ENCOUNTER — Ambulatory Visit: Payer: Self-pay

## 2019-03-12 ENCOUNTER — Other Ambulatory Visit: Payer: Self-pay

## 2019-03-12 ENCOUNTER — Ambulatory Visit (LOCAL_COMMUNITY_HEALTH_CENTER): Payer: Self-pay | Admitting: Physician Assistant

## 2019-03-12 ENCOUNTER — Encounter: Payer: Self-pay | Admitting: Physician Assistant

## 2019-03-12 VITALS — BP 149/98 | Ht 69.0 in | Wt 228.0 lb

## 2019-03-12 DIAGNOSIS — Z01419 Encounter for gynecological examination (general) (routine) without abnormal findings: Secondary | ICD-10-CM

## 2019-03-12 DIAGNOSIS — Z3042 Encounter for surveillance of injectable contraceptive: Secondary | ICD-10-CM

## 2019-03-12 DIAGNOSIS — Z30013 Encounter for initial prescription of injectable contraceptive: Secondary | ICD-10-CM

## 2019-03-12 DIAGNOSIS — Z3009 Encounter for other general counseling and advice on contraception: Secondary | ICD-10-CM

## 2019-03-12 MED ORDER — MEDROXYPROGESTERONE ACETATE 150 MG/ML IM SUSP
150.0000 mg | Freq: Once | INTRAMUSCULAR | Status: AC
Start: 2019-03-12 — End: 2019-03-12
  Administered 2019-03-12: 16:00:00 150 mg via INTRAMUSCULAR

## 2019-03-12 NOTE — Progress Notes (Signed)
[place] problem visit  [place] Department  Subjective:  Kelly Spence is a 28 y.o. being seen today for a pap smear and to continue with Depo.  Chief Complaint  Patient presents with  . Contraception    HPI  Patient states that she was told at the last visit that she would need a pap prior to getting her next Depo.  Reports that she has had vaginal bleeding since 02/15/2019.  Denies vaginal symptoms except for bleeding.  Per chart she is 15 wk 5 d since last Depo.  States that she is bleeding lightly today.  States that she has been thinking about trying to get pregnant in the next year or so and asks about SE after stopping Depo.  Reports that she still has not been evaluated by PCP for elevated BP.  Denies chest pain, blurred vision, headaches, dizziness.   Does the patient have a current or past history of drug use? No   No components found for: HCV]   Health Maintenance Due  Topic Date Due  . HIV Screening  10/01/2005  . TETANUS/TDAP  10/01/2009  . PAP-Cervical Cytology Screening  06/18/2018  . PAP SMEAR-Modifier  06/18/2018  . INFLUENZA VACCINE  10/21/2018    Review of Systems  All other systems reviewed and are negative.   The following portions of the patient's history were reviewed and updated as appropriate: allergies, current medications, past family history, past medical history, past social history, past surgical history and problem list. Problem list updated.   See flowsheet for other program required questions.  Objective:   Vitals:   03/12/19 1509  BP: (!) 149/98  Weight: 228 lb (103.4 kg)  Height: 5\' 9"  (1.753 m)    Physical Exam Vitals reviewed.  Constitutional:      General: She is not in acute distress.    Appearance: Normal appearance.  HENT:     Head: Normocephalic and atraumatic.  Eyes:     Conjunctiva/sclera: Conjunctivae normal.  Pulmonary:     Effort: Pulmonary effort is normal.  Abdominal:   Palpations: Abdomen is soft. There is no mass.     Tenderness: There is no abdominal tenderness. There is no guarding or rebound.  Genitourinary:    General: Normal vulva.     Rectum: Normal.     Comments: External genitalia/pubic area without nits, lice, edema, erythema, lesions and inguinal adenopathy. Vagina with normal mucosa and small amount of menstrual blood present. Cervix without visible lesions. Uterus firm, mobile, nt, no masses, no CMT, no adnexal tenderness or fullness. Skin:    General: Skin is warm and dry.     Findings: No bruising, erythema, lesion or rash.  Neurological:     Mental Status: She is alert and oriented to person, place, and time.  Psychiatric:        Mood and Affect: Mood normal.        Behavior: Behavior normal.        Thought Content: Thought content normal.        Judgment: Judgment normal.       Assessment and Plan:  [name] is a 28 y.o. female presenting to the [facility] Department for a Women's Health problem visit  1. Family planning Counseled patient that she should track cycle and determine when she ovulates once periods are back to being regular once she stops Depo. Counseled that having sex every other day around ovulation can give a good  chance of achieving pregnancy. Enc that she and partner engage in healthy habits such as well balanced diet, regular exercise and sleep, avoid smoking, EtOH and caffeine.  2. Encounter for counseling regarding contraception Counseled that after stopping Depo it can take up to 18 months before periods are regular and this can make it more difficult to get pregnant. Reassured that there is no reason to think she is not able to get pregnant at this point. Stressed to patient importance of continuing with BCM until after BP is under control. - medroxyPROGESTERone (DEPO-PROVERA) injection 150 mg  3. Pap smear, as part of routine gynecological examination Counseled that RN will call or  send letter in 2-3 weeks after results are back. - IGP, rfx Aptima HPV ASCU  4. Surveillance for Depo-Provera contraception OK for Depo 150mg  IM today - medroxyPROGESTERone (DEPO-PROVERA) injection 150 mg     Return in 11 weeks (on 05/28/2019) for Depo.  No future appointments.  Jerene Dilling, PA

## 2019-03-12 NOTE — Progress Notes (Signed)
Here for pap and Depo. Declines bloodwork. Aileen Fass, RN

## 2019-03-13 NOTE — Progress Notes (Signed)
Pt received Depo 150mg  IM per provider order and pt tolerated well. Provider orders completed.Ronny Bacon, RN

## 2019-03-16 LAB — IGP, RFX APTIMA HPV ASCU: PAP Smear Comment: 0

## 2019-03-22 ENCOUNTER — Telehealth: Payer: Self-pay

## 2019-03-27 NOTE — Telephone Encounter (Signed)
TC with patient.  Discussed abn pap and need for colpo. Patient would like referral to [facility]. Aileen Fass, RN

## 2019-03-30 ENCOUNTER — Other Ambulatory Visit: Payer: Self-pay

## 2019-04-03 ENCOUNTER — Ambulatory Visit: Payer: Self-pay | Attending: Oncology

## 2019-04-03 ENCOUNTER — Other Ambulatory Visit: Payer: Self-pay

## 2019-04-03 DIAGNOSIS — Z0000 Encounter for general adult medical examination without abnormal findings: Secondary | ICD-10-CM

## 2019-04-03 NOTE — Progress Notes (Addendum)
[name] for [facility] eligibilit due to COVID 19.  Patient to go directly to Appointment with Dr. Amalia Hailey on 04/19/19 for colposcopy.

## 2019-04-19 ENCOUNTER — Other Ambulatory Visit: Payer: Self-pay

## 2019-04-19 ENCOUNTER — Encounter: Payer: Self-pay | Admitting: Obstetrics and Gynecology

## 2019-04-19 ENCOUNTER — Other Ambulatory Visit (HOSPITAL_COMMUNITY)
Admission: RE | Admit: 2019-04-19 | Discharge: 2019-04-19 | Disposition: A | Discharge status: Home / Self Care | Payer: Self-pay | Source: Ambulatory Visit | Attending: Obstetrics and Gynecology | Admitting: Obstetrics and Gynecology

## 2019-04-19 ENCOUNTER — Ambulatory Visit (INDEPENDENT_AMBULATORY_CARE_PROVIDER_SITE_OTHER): Payer: Self-pay | Admitting: Obstetrics and Gynecology

## 2019-04-19 VITALS — BP 158/103 | HR 81 | Ht 70.0 in | Wt 231.0 lb

## 2019-04-19 DIAGNOSIS — R87612 Low grade squamous intraepithelial lesion on cytologic smear of cervix (LGSIL): Secondary | ICD-10-CM | POA: Insufficient documentation

## 2019-04-19 NOTE — Progress Notes (Signed)
   HPI:  Arizbeth L Collier is a 29 y.o.  G0P0000  who presents today for evaluation and management of abnormal cervical cytology.    Dysplasia History:  LGSIL  she reports this has her first abnormal Pap smear. ROS:    OB History  Gravida Para Term Preterm AB Living  0 0 0 0 0 0  SAB TAB Ectopic Multiple Live Births  0 0 0 0 0    Past Medical History:  Diagnosis Date  . Borderline diabetes    diagnosed in high school    Past Surgical History:  Procedure Laterality Date  . JOINT REPLACEMENT     5th toe, left foot    SOCIAL HISTORY: Social History   Substance and Sexual Activity  Alcohol Use No   Social History   Substance and Sexual Activity  Drug Use No     Family History  Problem Relation Age of Onset  . Hypertension Mother   . Hypertension Father   . Lung disease Father   . Seizures Cousin   . Cancer Paternal Grandmother   . Parkinson's disease Paternal Grandmother   . Diabetes Maternal Grandmother   . Stroke Maternal Grandmother   . Cancer Paternal Uncle   . Aneurysm Maternal Grandfather   . Pancreatic cancer Paternal Grandfather     ALLERGIES:  Chocolate and Nickel  She has a current medication list which includes the following prescription(s): naproxen, cyclobenzaprine, ibuprofen, ibuprofen, and methocarbamol, and the following Facility-Administered Medications: medroxyprogesterone.  Physical Exam: -Vitals:  BP (!) 158/103   Pulse 81   Ht 5\' 10"  (1.778 m)   Wt 231 lb (104.8 kg)   BMI 33.15 kg/m   PROCEDURE: Colposcopy performed with 4% acetic acid after verbal consent obtained                           -Aceto-white Lesions Location(s): None              -Biopsy performed at none              -ECC indicated and performed: Yes.  Cytobrush     -Biopsy sites made hemostatic with pressure and Monsel's solution   -Satisfactory colposcopy: Yes.      -Evidence of Invasive cervical CA :  NO  ASSESSMENT:  Lunette L Scheurich is a 29 y.o. G0P0000  here for  1. Low grade squamous intraepithelial lesion on cytologic smear of cervix (LGSIL)   .  PLAN: 1.  I discussed the grading system of pap smears and HPV high risk viral types.  We will discuss management after colpo results return. 2.  Because no acetowhite changes or abnormalities were noted that colposcopy a Cytobrush ECC was performed and I will do viral typing.  We will discuss management when results return.  No orders of the defined types were placed in this encounter.          F/U  Return for We will contact her with any abnormal test results.  Jeannie Fend ,MD 04/19/2019,11:38 AM

## 2019-04-19 NOTE — Progress Notes (Signed)
Reviewed note from [facility] and patient to follow up re:  colpo results per their recs.

## 2019-04-30 LAB — CYTOLOGY - PAP
Comment: NEGATIVE
Comment: NEGATIVE
Comment: NEGATIVE
HPV 16: NEGATIVE
HPV 18 / 45: NEGATIVE
High risk HPV: POSITIVE — AB

## 2019-05-03 NOTE — Progress Notes (Signed)
Per Dr. Amalia Hailey recommendation, patient to have pap in one year. Jeanella Anton, Patient Care Coordinator to schedule one year follow-up BCCCP appointment followed by appointment with Dr. Amalia Hailey for pap.  Phoned patient with recommendation information.

## 2019-05-15 ENCOUNTER — Ambulatory Visit: Payer: Self-pay | Admitting: Obstetrics and Gynecology

## 2019-05-15 ENCOUNTER — Ambulatory Visit: Payer: Self-pay

## 2020-05-14 ENCOUNTER — Ambulatory Visit: Payer: Self-pay

## 2020-05-14 ENCOUNTER — Ambulatory Visit: Payer: Self-pay | Admitting: Obstetrics and Gynecology

## 2020-05-27 ENCOUNTER — Encounter: Payer: Self-pay | Admitting: Obstetrics and Gynecology

## 2020-06-05 ENCOUNTER — Encounter: Payer: Self-pay | Admitting: Obstetrics and Gynecology

## 2020-06-17 ENCOUNTER — Encounter: Payer: Self-pay | Admitting: Obstetrics and Gynecology

## 2020-06-20 ENCOUNTER — Ambulatory Visit (INDEPENDENT_AMBULATORY_CARE_PROVIDER_SITE_OTHER): Payer: Self-pay | Admitting: Obstetrics and Gynecology

## 2020-06-20 ENCOUNTER — Other Ambulatory Visit: Payer: Self-pay

## 2020-06-20 ENCOUNTER — Encounter: Payer: Self-pay | Admitting: Obstetrics and Gynecology

## 2020-06-20 ENCOUNTER — Other Ambulatory Visit (HOSPITAL_COMMUNITY)
Admission: RE | Admit: 2020-06-20 | Discharge: 2020-06-20 | Disposition: A | Discharge status: Home / Self Care | Payer: Self-pay | Source: Ambulatory Visit | Attending: Obstetrics and Gynecology | Admitting: Obstetrics and Gynecology

## 2020-06-20 VITALS — BP 135/86 | HR 77 | Temp 99.5°F | Resp 16 | Ht 71.0 in | Wt 254.8 lb

## 2020-06-20 DIAGNOSIS — Z01419 Encounter for gynecological examination (general) (routine) without abnormal findings: Secondary | ICD-10-CM

## 2020-06-20 DIAGNOSIS — Z30011 Encounter for initial prescription of contraceptive pills: Secondary | ICD-10-CM

## 2020-06-20 DIAGNOSIS — Z8742 Personal history of other diseases of the female genital tract: Secondary | ICD-10-CM

## 2020-06-20 DIAGNOSIS — A630 Anogenital (venereal) warts: Secondary | ICD-10-CM

## 2020-06-20 DIAGNOSIS — N946 Dysmenorrhea, unspecified: Secondary | ICD-10-CM

## 2020-06-20 DIAGNOSIS — Z3169 Encounter for other general counseling and advice on procreation: Secondary | ICD-10-CM

## 2020-06-20 MED ORDER — DESOGESTREL-ETHINYL ESTRADIOL 0.15-0.02/0.01 MG (21/5) PO TABS: 1.0000 | ORAL_TABLET | Freq: Every day | ORAL | 1 refills | Status: DC

## 2020-06-20 NOTE — Progress Notes (Addendum)
HPI:      [name] is a 30 y.o. G0P0000 who LMP was No LMP recorded. Patient has had an injection.  Subjective:   She presents today for her annual examination.  She has several other issues she would like to discuss at this exam. 1.  She reports that she has noticed some bumps around her posterior vulva/anus which have been present for approximately 6 weeks.  (Patient does smoke cigarettes) 2.  She states she has had unprotected intercourse for greater than 1 year with normal monthly menses without resultant pregnancy.  She says she would like to have a baby in the near future and wants to discuss the possibility of infertility.  She reports her partner has never fathered a child and she has never been pregnant. 3.  History of LG SIL and positive HPV. 4.  She complains of very heavy menstrual bleeding with significant cramping.  She would like to control this in some way.    Hx: The following portions of the patient's history were reviewed and updated as appropriate:             She  has a past medical history of Borderline diabetes. She does not have any pertinent problems on file. She  has a past surgical history that includes Joint replacement. Her family history includes Aneurysm in her maternal grandfather; Cancer in her paternal grandmother and paternal uncle; Diabetes in her maternal grandmother; Hypertension in her father and mother; Lung disease in her father; Pancreatic cancer in her paternal grandfather; Parkinson's disease in her paternal grandmother; Seizures in her cousin; Stroke in her maternal grandmother. She  reports that she has been smoking cigarettes. She has been smoking about 0.10 packs per day. She has never used smokeless tobacco. She reports that she does not drink alcohol and does not use drugs. She has a current medication list which includes the following prescription(s): clobetasol ointment, desogestrel-ethinyl estradiol, and ibuprofen. She is allergic  to chocolate and nickel.       Review of Systems:  Review of Systems  Constitutional: Denied constitutional symptoms, night sweats, recent illness, fatigue, fever, insomnia and weight loss.  Eyes: Denied eye symptoms, eye pain, photophobia, vision change and visual disturbance.  Ears/Nose/Throat/Neck: Denied ear, nose, throat or neck symptoms, hearing loss, nasal discharge, sinus congestion and sore throat.  Cardiovascular: Denied cardiovascular symptoms, arrhythmia, chest pain/pressure, edema, exercise intolerance, orthopnea and palpitations.  Respiratory: Denied pulmonary symptoms, asthma, pleuritic pain, productive sputum, cough, dyspnea and wheezing.  Gastrointestinal: Denied, gastro-esophageal reflux, melena, nausea and vomiting.  Genitourinary: See HPI for additional information.  Musculoskeletal: Denied musculoskeletal symptoms, stiffness, swelling, muscle weakness and myalgia.  Dermatologic: Denied dermatology symptoms, rash and scar.  Neurologic: Denied neurology symptoms, dizziness, headache, neck pain and syncope.  Psychiatric: Denied psychiatric symptoms, anxiety and depression.  Endocrine: Denied endocrine symptoms including hot flashes and night sweats.   Meds:   Current Outpatient Medications on File Prior to Visit  Medication Sig Dispense Refill  . clobetasol ointment (TEMOVATE) 0.05 % Apply topically 2 (two) times daily.    Marland Kitchen ibuprofen (ADVIL,MOTRIN) 800 MG tablet Take 1 tablet (800 mg total) by mouth every 8 (eight) hours as needed for moderate pain. 15 tablet 0   No current facility-administered medications on file prior to visit.       Upstream - 06/20/20 1121      Pregnancy Intention Screening   Does the patient want to become pregnant in the next year? Ok Either [employer]  Does the patient's partner want to become pregnant in the next year? Ok [place]    Would the patient like to discuss contraceptive options today? No          The pregnancy intention  screening data noted above was reviewed. Potential methods of contraception were discussed. The patient elected to proceed with Oral Contraceptive.     Objective:     Vitals:   06/20/20 1118  BP: 135/86  Pulse: 77  Resp: 16  Temp: 99.5 F (37.5 C)    Filed Weights   06/20/20 1118  Weight: 254 lb 12.8 oz (115.6 kg)              Physical examination General NAD, Conversant  HEENT Atraumatic; Op clear with mmm.  Normo-cephalic. Pupils reactive. Anicteric sclerae  Thyroid/Neck Smooth without nodularity or enlargement. Normal ROM.  Neck Supple.  Skin No rashes, lesions or ulceration. Normal palpated skin turgor. No nodularity.  Breasts: No masses or discharge.  Symmetric.  No axillary adenopathy.  Lungs: Clear to auscultation.No rales or wheezes. Normal Respiratory effort, no retractions.  Heart: NSR.  No murmurs or rubs appreciated. No periferal edema  Abdomen: Soft.  Non-tender.  No masses.  No HSM. No hernia  Extremities: Moves all appropriately.  Normal ROM for age. No lymphadenopathy.  Neuro: Oriented to PPT.  Normal mood. Normal affect.     Pelvic:   Vulva: Normal appearance.  Multiple small condyloma noted  Vagina: No lesions or abnormalities noted.  Support: Normal pelvic support.  Urethra No masses tenderness or scarring.  Meatus Normal size without lesions or prolapse.  Cervix: Normal appearance.  No lesions.  Anus: Normal exam.  No lesions.  Perineum: Normal exam.  No lesions.        Bimanual   Uterus: Normal size.  Non-tender.  Mobile.  AV.  Adnexae: No masses.  Non-tender to palpation.  Cul-de-sac: Negative for abnormality.      Assessment:    G0P0000 Patient Active Problem List   Diagnosis Date Noted  . Tobacco use 12/01/2018  . Obesity, unspecified 03/31/2018  . Elevated blood pressure reading without diagnosis of hypertension 03/01/2017     1. Well woman exam with routine gynecological exam   2. Condyloma acuminatum of vulva   3. History of  abnormal cervical Pap smear   4. Dysmenorrhea   5. Infertility counseling   6. Initiation of OCP (BCP)     At this time she states she does not want immediate pregnancy.  She would rather have cycle control at this time.   Plan:            1.  Basic Screening Recommendations The basic screening recommendations for asymptomatic women were discussed with the patient during her visit.  The age-appropriate recommendations were discussed with her and the rational for the tests reviewed.  When I am informed by the patient that another primary care physician has previously obtained the age-appropriate tests and they are up-to-date, only outstanding tests are ordered and referrals given as necessary.  Abnormal results of tests will be discussed with her when all of her results are completed.  Routine preventative health maintenance measures emphasized: Exercise/Diet/Weight control, Tobacco Warnings, Alcohol/Substance use risks and Stress Management Pap performed despite heavy menstrual bleeding-if Pap limited by bleeding recommend repeat in 3 months at [facility] follow-up visit. 2.  Discussed infertility work-up and testing.  She had numerous questions regarding timing of intercourse as well as female infertility. (He believes he may  not be able to conceive)  3.  Multiple ways of controlling dysmenorrhea discussed in detail.  Patient is chosen to start OCPs.   OCPs The risks /benefits of OCPs have been explained to the patient in detail.  Product literature has been given to her where appropriate.  I have instructed her in the use of OCPs.  I have explained to the patient that OCPs are not as effective for birth control during the first month of use, and that another form of contraception should be used during this time.  Both first-day start and Sunday start have been explained.  The risks and benefits of each was discussed.  She has been made aware of  the fact that in rare circumstances, other medications may  affect the efficacy of OCPs.  I have answered all of her questions, and I believe that she has an understanding of the effectiveness and use of OCPs. 4.  Advised patient to discontinue tobacco use. 5.  Multiple small new onset vulvar condyloma discussed.  HPV virus discussed.  Recommend patient discontinue tobacco products for at least 6 months prior to treatment for vulvar condyloma. Orders No orders of the defined types were placed in this encounter.    Meds ordered this encounter  Medications  . desogestrel-ethinyl estradiol (MIRCETTE) 0.15-0.02/0.01 MG (21/5) tablet    Sig: Take 1 tablet by mouth at bedtime.    Dispense:  84 tablet    Refill:  1          F/U  Return in about 3 months (around 09/19/2020). I spent 24 additional minutes involved in the care of this patient preparing to see the patient by obtaining and reviewing her medical history (including labs, imaging tests and prior procedures), documenting clinical information in the electronic health record (EHR), counseling and coordinating care plans, writing and sending prescriptions, ordering tests or procedures and directly communicating with the patient by discussing pertinent items from her history and physical exam as well as detailing my assessment and plan as noted above so that she has an informed understanding.  See #234 and 5 above as additional time spent on topics not typically associated with well woman exam.  All of her questions were answered.  Finis Bud, M.D. 06/20/2020 12:08 PM

## 2020-06-20 NOTE — Addendum Note (Signed)
Addended by: Edwyna Shell on: 06/20/2020 12:23 PM   Modules accepted: Orders

## 2020-06-26 LAB — CYTOLOGY - PAP
Comment: NEGATIVE
High risk HPV: POSITIVE — AB

## 2020-07-10 ENCOUNTER — Other Ambulatory Visit (HOSPITAL_COMMUNITY)
Admission: RE | Admit: 2020-07-10 | Discharge: 2020-07-10 | Disposition: A | Discharge status: Home / Self Care | Payer: Commercial Managed Care - PPO | Source: Ambulatory Visit | Attending: Obstetrics and Gynecology | Admitting: Obstetrics and Gynecology

## 2020-07-10 ENCOUNTER — Ambulatory Visit (INDEPENDENT_AMBULATORY_CARE_PROVIDER_SITE_OTHER): Payer: Commercial Managed Care - PPO | Admitting: Obstetrics and Gynecology

## 2020-07-10 ENCOUNTER — Other Ambulatory Visit: Payer: Self-pay

## 2020-07-10 ENCOUNTER — Encounter: Payer: Self-pay | Admitting: Obstetrics and Gynecology

## 2020-07-10 VITALS — BP 147/94 | HR 80 | Ht 70.0 in | Wt 251.1 lb

## 2020-07-10 DIAGNOSIS — B977 Papillomavirus as the cause of diseases classified elsewhere: Secondary | ICD-10-CM | POA: Insufficient documentation

## 2020-07-10 DIAGNOSIS — R87612 Low grade squamous intraepithelial lesion on cytologic smear of cervix (LGSIL): Secondary | ICD-10-CM

## 2020-07-10 DIAGNOSIS — N72 Inflammatory disease of cervix uteri: Secondary | ICD-10-CM | POA: Diagnosis present

## 2020-07-10 NOTE — Addendum Note (Signed)
Addended by: Durwin Glaze on: 07/10/2020 10:11 AM   Modules accepted: Orders

## 2020-07-10 NOTE — Progress Notes (Signed)
   HPI:  Kelly Spence is a 30 y.o.  G0P0000  who presents today for evaluation and management of abnormal cervical cytology.    Dysplasia History: Patient has now had LGSIL Pap 2 years and will.  Has positive HPV.   Of significant note, patient also has external condyloma.   ROS:  Pertinent items noted in HPI and remainder of comprehensive ROS otherwise negative.  OB History  Gravida Para Term Preterm AB Living  0 0 0 0 0 0  SAB IAB Ectopic Multiple Live Births  0 0 0 0 0    Past Medical History:  Diagnosis Date  . Borderline diabetes    diagnosed in high school    Past Surgical History:  Procedure Laterality Date  . JOINT REPLACEMENT     5th toe, left foot    SOCIAL HISTORY: Social History   Substance and Sexual Activity  Alcohol Use No   Social History   Substance and Sexual Activity  Drug Use No     Family History  Problem Relation Age of Onset  . Hypertension Mother   . Hypertension Father   . Lung disease Father   . Seizures Cousin   . Cancer Paternal Grandmother   . Parkinson's disease Paternal Grandmother   . Diabetes Maternal Grandmother   . Stroke Maternal Grandmother   . Cancer Paternal Uncle   . Aneurysm Maternal Grandfather   . Pancreatic cancer Paternal Grandfather     ALLERGIES:  Chocolate and Nickel  She has a current medication list which includes the following prescription(s): clobetasol ointment, ibuprofen, and desogestrel-ethinyl estradiol.  Physical Exam: -Vitals:  BP (!) 147/94   Pulse 80   Ht 5\' 10"  (1.778 m)   Wt 251 lb 1.6 oz (113.9 kg)   LMP 06/20/2020   BMI 36.03 kg/m   PROCEDURE: Colposcopy performed with 4% acetic acid after verbal consent obtained             vvvvvvvv              -Aceto-white Lesions Location(s): 6  o'clock.  (There is minimal acetowhite change noted)              -Biopsy performed at 6 o'clock               -ECC indicated and performed: No.     -Biopsy sites made hemostatic with pressure  and Monsel's solution   -Satisfactory colposcopy: Yes.    (Full transition zone seen)   -Evidence of Invasive cervical CA :  NO  ASSESSMENT:  Kelly Spence is a 30 y.o. G0P0000 here for  1. Low grade squamous intraepithelial lesion on cytologic smear of cervix (LGSIL)   2. High risk human papilloma virus (HPV) infection of cervix   .  PLAN: 1.  I discussed the grading system of pap smears and HPV high risk viral types.  We will discuss management after colpo results return.  No orders of the defined types were placed in this encounter.          F/U  Return in about 2 weeks (around 07/24/2020) for Colpo f/u.  Jeannie Fend ,MD 07/10/2020,8:38 AM

## 2020-07-11 LAB — SURGICAL PATHOLOGY

## 2020-07-21 ENCOUNTER — Other Ambulatory Visit: Payer: Self-pay

## 2020-07-21 ENCOUNTER — Encounter: Payer: Self-pay | Admitting: Obstetrics and Gynecology

## 2020-07-21 ENCOUNTER — Ambulatory Visit (INDEPENDENT_AMBULATORY_CARE_PROVIDER_SITE_OTHER): Payer: Commercial Managed Care - PPO | Admitting: Obstetrics and Gynecology

## 2020-07-21 VITALS — BP 157/98 | HR 85 | Ht 70.0 in | Wt 254.4 lb

## 2020-07-21 DIAGNOSIS — N870 Mild cervical dysplasia: Secondary | ICD-10-CM | POA: Diagnosis not present

## 2020-07-21 NOTE — Progress Notes (Signed)
HPI:      [name] is a 30 y.o. G0P0000 who LMP was Patient's last menstrual period was 07/14/2020.  Subjective:   She presents today for follow-up of her colposcopy which again revealed CIN-1.  She has dealt with abnormal Pap smears and CIN-1 for several years now.    Hx: The following portions of the patient's history were reviewed and updated as appropriate:             She  has a past medical history of Borderline diabetes. She does not have any pertinent problems on file. She  has a past surgical history that includes Joint replacement. Her family history includes Aneurysm in her maternal grandfather; Cancer in her paternal grandmother and paternal uncle; Diabetes in her maternal grandmother; Hypertension in her father and mother; Lung disease in her father; Pancreatic cancer in her paternal grandfather; Parkinson's disease in her paternal grandmother; Seizures in her cousin; Stroke in her maternal grandmother. She  reports that she has been smoking cigarettes. She has been smoking about 0.10 packs per day. She has never used smokeless tobacco. She reports that she does not drink alcohol and does not use drugs. She has a current medication list which includes the following prescription(s): clobetasol ointment, desogestrel-ethinyl estradiol, and ibuprofen. She is allergic to chocolate and nickel.       Review of Systems:  Review of Systems  Constitutional: Denied constitutional symptoms, night sweats, recent illness, fatigue, fever, insomnia and weight loss.  Eyes: Denied eye symptoms, eye pain, photophobia, vision change and visual disturbance.  Ears/Nose/Throat/Neck: Denied ear, nose, throat or neck symptoms, hearing loss, nasal discharge, sinus congestion and sore throat.  Cardiovascular: Denied cardiovascular symptoms, arrhythmia, chest pain/pressure, edema, exercise intolerance, orthopnea and palpitations.  Respiratory: Denied pulmonary symptoms, asthma, pleuritic pain,  productive sputum, cough, dyspnea and wheezing.  Gastrointestinal: Denied, gastro-esophageal reflux, melena, nausea and vomiting.  Genitourinary: Denied genitourinary symptoms including symptomatic vaginal discharge, pelvic relaxation issues, and urinary complaints.  Musculoskeletal: Denied musculoskeletal symptoms, stiffness, swelling, muscle weakness and myalgia.  Dermatologic: Denied dermatology symptoms, rash and scar.  Neurologic: Denied neurology symptoms, dizziness, headache, neck pain and syncope.  Psychiatric: Denied psychiatric symptoms, anxiety and depression.  Endocrine: Denied endocrine symptoms including hot flashes and night sweats.   Meds:   Current Outpatient Medications on File Prior to Visit  Medication Sig Dispense Refill  . clobetasol ointment (TEMOVATE) 0.05 % Apply topically 2 (two) times daily.    Marland Kitchen desogestrel-ethinyl estradiol (MIRCETTE) 0.15-0.02/0.01 MG (21/5) tablet Take 1 tablet by mouth at bedtime. (Patient not taking: Reported on 07/10/2020) 84 tablet 1  . ibuprofen (ADVIL,MOTRIN) 800 MG tablet Take 1 tablet (800 mg total) by mouth every 8 (eight) hours as needed for moderate pain. 15 tablet 0   No current facility-administered medications on file prior to visit.          Objective:     Vitals:   07/21/20 0847  BP: (!) 157/98  Pulse: 85   Filed Weights   07/21/20 0847  Weight: 254 lb 6.4 oz (115.4 kg)              CIN-1 by colposcopically directed biopsies  Assessment:    G0P0000 Patient Active Problem List   Diagnosis Date Noted  . Tobacco use 12/01/2018  . Obesity, unspecified 03/31/2018  . Elevated blood pressure reading without diagnosis of hypertension 03/01/2017     1. CIN I (cervical intraepithelial neoplasia I)     Pictures reviewed from colposcopy.  Lesion is very small in size.   Plan:            1.  Patient has had CIN-1 for several years now.  We have discussed the possibility of continuing to follow this with Paps  and colposcopies versus attempting to remove the abnormal area by LEEP.  Risks and benefits of each discussed in detail.  All questions answered. Ms. Mom has chosen to have 1 more Pap Co. test and colposcopy if needed.  If CIN-1 returns she would like to consider LEEP at that time. Plan will be to follow-up in 1 year for annual examination and Pap-based on Pap we will decide future management. Orders No orders of the defined types were placed in this encounter.   No orders of the defined types were placed in this encounter.     F/U  Return in about 1 year (around 07/21/2021) for Annual Physical. I spent 23 minutes involved in the care of this patient preparing to see the patient by obtaining and reviewing her medical history (including labs, imaging tests and prior procedures), documenting clinical information in the electronic health record (EHR), counseling and coordinating care plans, writing and sending prescriptions, ordering tests or procedures and directly communicating with the patient by discussing pertinent items from her history and physical exam as well as detailing my assessment and plan as noted above so that she has an informed understanding.  All of her questions were answered.  Finis Bud, M.D. 07/21/2020 9:17 AM

## 2020-08-21 ENCOUNTER — Encounter: Payer: Self-pay | Admitting: Obstetrics and Gynecology

## 2020-08-21 ENCOUNTER — Ambulatory Visit (INDEPENDENT_AMBULATORY_CARE_PROVIDER_SITE_OTHER): Payer: Commercial Managed Care - PPO | Admitting: Obstetrics and Gynecology

## 2020-08-21 ENCOUNTER — Other Ambulatory Visit: Payer: Self-pay

## 2020-08-21 VITALS — BP 148/87 | HR 98 | Ht 70.0 in | Wt 254.3 lb

## 2020-08-21 DIAGNOSIS — N912 Amenorrhea, unspecified: Secondary | ICD-10-CM | POA: Diagnosis not present

## 2020-08-21 DIAGNOSIS — Z3200 Encounter for pregnancy test, result unknown: Secondary | ICD-10-CM

## 2020-08-21 DIAGNOSIS — O161 Unspecified maternal hypertension, first trimester: Secondary | ICD-10-CM | POA: Diagnosis not present

## 2020-08-21 LAB — POCT URINE PREGNANCY: Preg Test, Ur: POSITIVE — AB

## 2020-08-21 NOTE — Progress Notes (Signed)
HPI:      Kelly Spence is a 30 y.o. G1P0000 who LMP was Patient's last menstrual period was 07/14/2020.  Subjective:   She presents today stating that she has missed a menstrual period.  Based on her LMP she is approximately [redacted] weeks gestation.  She has occasional nausea but is not vomiting.  She has not yet started prenatal vitamins.  2 weeks ago she was started on an antihypertensive.  She is currently taking half a pill at this time. 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    Hx: The following portions of the patient's history were reviewed and updated as appropriate:             She  has a past medical history of Borderline diabetes. She does not have any pertinent problems on file. She  has a past surgical history that includes Joint replacement. Her family history includes Aneurysm in her maternal grandfather; Cancer in her paternal grandmother and paternal uncle; Diabetes in her maternal grandmother; Hypertension in her father and mother; Lung disease in her father; Pancreatic cancer in her paternal grandfather; Parkinson's disease in her paternal grandmother; Seizures in her cousin; Stroke in her maternal grandmother. She  reports that she has been smoking cigarettes. She has been smoking about 0.10 packs per day. She has never used smokeless tobacco. She reports that she does not drink alcohol and does not use drugs. She has a current medication list which includes the following prescription(s): chlorthalidone and clobetasol ointment. She is allergic to chocolate and nickel.       Review of Systems:  Review of Systems  Constitutional: Denied constitutional symptoms, night sweats, recent illness, fatigue, fever, insomnia and weight loss.  Eyes: Denied eye symptoms, eye pain, photophobia, vision change and visual disturbance.  Ears/Nose/Throat/Neck: Denied ear, nose, throat or neck symptoms, hearing loss, nasal discharge, sinus congestion and sore throat.   Cardiovascular: Denied cardiovascular symptoms, arrhythmia, chest pain/pressure, edema, exercise intolerance, orthopnea and palpitations.  Respiratory: Denied pulmonary symptoms, asthma, pleuritic pain, productive sputum, cough, dyspnea and wheezing.  Gastrointestinal: Denied, gastro-esophageal reflux, melena, nausea and vomiting.  Genitourinary: Denied genitourinary symptoms including symptomatic vaginal discharge, pelvic relaxation issues, and urinary complaints.  Musculoskeletal: Denied musculoskeletal symptoms, stiffness, swelling, muscle weakness and myalgia.  Dermatologic: Denied dermatology symptoms, rash and scar.  Neurologic: Denied neurology symptoms, dizziness, headache, neck pain and syncope.  Psychiatric: Denied psychiatric symptoms, anxiety and depression.  Endocrine: Denied endocrine symptoms including hot flashes and night sweats.   Meds:   Current Outpatient Medications on File Prior to Visit  Medication Sig Dispense Refill  . chlorthalidone (HYGROTON) 25 MG tablet Take by mouth.    . clobetasol ointment (TEMOVATE) 0.05 % Apply topically 2 (two) times daily.     No current facility-administered medications on file prior to visit.       The pregnancy intention screening data noted above was reviewed. Potential methods of contraception were discussed. The patient elected to proceed with Pregnant/Seeking Pregnancy.     Objective:     Vitals:   08/21/20 1253  BP: (!) 148/87  Pulse: 98   Filed Weights   08/21/20 1253  Weight: 254 lb 4.8 oz (115.3 kg)              Urinary beta-hCG positive  Assessment:    G1P0000 Patient Active Problem List   Diagnosis Date Noted  . Tobacco use 12/01/2018  . Obesity, unspecified 03/31/2018  . Elevated blood pressure  reading without diagnosis of hypertension 03/01/2017     1. Amenorrhea   2. Possible pregnancy, not confirmed   3. Hypertension affecting pregnancy in first trimester     Estimated gestational age [redacted] weeks  based on LMP.     Plan:            Prenatal Plan 1.  The patient was given prenatal literature. 2.  She was begun on prenatal vitamins. 3.  A prenatal lab panel to be drawn at nurse visit. 4.  An ultrasound was ordered to better determine an EDC. 5.  A nurse visit was scheduled. 6.  Genetic testing and testing for other inheritable conditions discussed in detail. She will decide in the future whether to have these labs performed. 7.  A general overview of pregnancy testing, visit schedule, ultrasound schedule, and prenatal care was discussed. 8.  COVID and its risks associated with pregnancy, prevention by limiting exposure and use of masks, as well as the risks and benefits of vaccination during pregnancy were discussed in detail.  [facility] policy regarding office and hospital visitation and testing was explained. 9.  Benefits of breast-feeding discussed in detail including both maternal and infant benefits. Ready Set Baby website discussed. 10.  Have asked the patient to discontinue her chlorthalidone as she has just begun it and is only taking half dose.  We will follow her during her pregnancy and if she remains hypertensive will consider use of labetalol. 11.  Likely begin 81 mg ASA at 12 weeks.   Orders Orders Placed This Encounter  Procedures  . US OB Comp Less 14 Wks  . POCT urine pregnancy    No orders of the defined types were placed in this encounter.     F/U  Return in about 6 weeks (around 10/02/2020). I spent 25 minutes involved in the care of this patient preparing to see the patient by obtaining and reviewing her medical history (including labs, imaging tests and prior procedures), documenting clinical information in the electronic health record (EHR), counseling and coordinating care plans, writing and sending prescriptions, ordering tests or procedures and directly communicating with the patient by discussing pertinent items from her history and physical exam as well as  detailing my assessment and plan as noted above so that she has an informed understanding.  All of her questions were answered.  Finis Bud, M.D. 08/21/2020 2:12 PM

## 2020-09-15 ENCOUNTER — Ambulatory Visit
Admission: RE | Admit: 2020-09-15 | Discharge: 2020-09-15 | Disposition: A | Discharge status: Home / Self Care | Payer: Commercial Managed Care - PPO | Source: Ambulatory Visit | Attending: Obstetrics and Gynecology | Admitting: Obstetrics and Gynecology

## 2020-09-15 ENCOUNTER — Other Ambulatory Visit: Payer: Self-pay

## 2020-09-15 ENCOUNTER — Other Ambulatory Visit: Payer: Self-pay | Admitting: Obstetrics and Gynecology

## 2020-09-15 ENCOUNTER — Ambulatory Visit (INDEPENDENT_AMBULATORY_CARE_PROVIDER_SITE_OTHER): Payer: Commercial Managed Care - PPO

## 2020-09-15 VITALS — BP 147/83 | HR 78 | Ht 70.0 in | Wt 256.1 lb

## 2020-09-15 DIAGNOSIS — N912 Amenorrhea, unspecified: Secondary | ICD-10-CM

## 2020-09-15 DIAGNOSIS — Z3200 Encounter for pregnancy test, result unknown: Secondary | ICD-10-CM

## 2020-09-15 DIAGNOSIS — Z3401 Encounter for supervision of normal first pregnancy, first trimester: Secondary | ICD-10-CM

## 2020-09-15 DIAGNOSIS — Z113 Encounter for screening for infections with a predominantly sexual mode of transmission: Secondary | ICD-10-CM | POA: Diagnosis not present

## 2020-09-15 DIAGNOSIS — Z0283 Encounter for blood-alcohol and blood-drug test: Secondary | ICD-10-CM

## 2020-09-15 DIAGNOSIS — R638 Other symptoms and signs concerning food and fluid intake: Secondary | ICD-10-CM | POA: Diagnosis not present

## 2020-09-15 MED ORDER — ONDANSETRON HCL 4 MG PO TABS: 4.0000 mg | ORAL_TABLET | Freq: Three times a day (TID) | ORAL | 0 refills | Status: DC | PRN

## 2020-09-15 NOTE — Progress Notes (Signed)
      Kelly Spence presents for NOB nurse intake visit. Pregnancy confirmation done at [facility], 08/21/2020, with Harlin Heys, MD  G1.  P0.  LMP 07/14/2020.  EDD 04/20/2020.  [place] [redacted]w[redacted]d. Pregnancy education material explained and given.  0 cats in the home.  NOB labs ordered. BMI greater than 30. TSH/HbgA1c ordered. Sickle cell order due to race. HIV and drug screen explained and ordered. Genetic screening discussed. Genetic testing;Unsure; would like to discuss and get more information from the provider. Pt to discuss genetic testing with provider. PNV encouraged. Pt to follow up with provider in 3 weeks for NOB physical.  [place], HIV/Drug screening, FMLA form completed and signed by patient.

## 2020-09-15 NOTE — Patient Instructions (Signed)
WHAT OB PATIENTS CAN EXPECT  Confirmation of pregnancy and ultrasound ordered if medically indicated-[redacted] weeks gestation New OB (NOB) intake with nurse and New OB (NOB) labs- [redacted] weeks gestation New OB (NOB) physical examination with provider- 11/[redacted] weeks gestation Flu vaccine-[redacted] weeks gestation Anatomy scan-[redacted] weeks gestation Glucose tolerance test, blood work to test for anemia, T-dap vaccine-[redacted] weeks gestation Vaginal swabs/cultures-STD/Group B strep-[redacted] weeks gestation Appointments every 4 weeks until 28 weeks Every 2 weeks from 28 weeks until 36 weeks Weekly visits from 36 weeks until delivery  Commonly Asked Questions During Pregnancy  Cats: A parasite can be excreted in cat feces.  To avoid exposure you need to have another person empty the little box.  If you must empty the litter box you will need to wear gloves.  Wash your hands after handling your cat.  This parasite can also be found in raw or undercooked meat so this should also be avoided.  Colds, Sore Throats, Flu: Please check your medication sheet to see what you can take for symptoms.  If your symptoms are unrelieved by these medications please call the office.  Dental Work: Most any dental work Investment banker, corporate recommends is permitted.  X-rays should only be taken during the first trimester if absolutely necessary.  Your abdomen should be shielded with a lead apron during all x-rays.  Please notify your provider prior to receiving any x-rays.  Novocaine is fine; gas is not recommended.  If your dentist requires a note from [place] prior to dental work please call the office and we will provide one for you.  Exercise: Exercise is an important part of staying healthy during your pregnancy.  You may continue most exercises you were accustomed to prior to pregnancy.  Later in your pregnancy you will most likely notice you have difficulty with activities requiring balance like riding a bicycle.  It is important that you listen to your body and  avoid activities that put you at a higher risk of falling.  Adequate rest and staying well hydrated are a must!  If you have questions about the safety of specific activities ask your provider.    Exposure to Children with illness: Try to avoid obvious exposure; report any symptoms to [place] when noted,  If you have chicken pos, red measles or mumps, you should be immune to these diseases.   Please do not take any vaccines while pregnant unless you have checked with your OB provider.  Fetal Movement: After 28 weeks we recommend you do "kick counts" twice daily.  Lie or sit down in a calm quiet environment and count your baby movements "kicks".  You should feel your baby at least 10 times per hour.  If you have not felt 10 kicks within the first hour get up, walk around and have something sweet to eat or drink then repeat for an additional hour.  If count remains less than 10 per hour notify your provider.  Fumigating: Follow your pest control agent's advice as to how long to stay out of your home.  Ventilate the area well before re-entering.  Hemorrhoids:   Most over-the-counter preparations can be used during pregnancy.  Check your medication to see what is safe to use.  It is important to use a stool softener or fiber in your diet and to drink lots of liquids.  If hemorrhoids seem to be getting worse please call the office.   Hot Tubs:  Hot tubs Jacuzzis and saunas are not recommended while pregnant.  These increase your internal body temperature and should be avoided.  Intercourse:  Sexual intercourse is safe during pregnancy as long as you are comfortable, unless otherwise advised by your provider.  Spotting may occur after intercourse; report any bright red bleeding that is heavier than spotting.  Labor:  If you know that you are in labor, please go to the hospital.  If you are unsure, please call the office and let us help you decide what to do.  Lifting, straining, etc:  If your job requires heavy  lifting or straining please check with your provider for any limitations.  Generally, you should not lift items heavier than that you can lift simply with your hands and arms (no back muscles)  Painting:  Paint fumes do not harm your pregnancy, but may make you ill and should be avoided if possible.  Latex or water based paints have less odor than oils.  Use adequate ventilation while painting.  Permanents & Hair Color:  Chemicals in hair dyes are not recommended as they cause increase hair dryness which can increase hair loss during pregnancy.  " Highlighting" and permanents are allowed.  Dye may be absorbed differently and permanents may not hold as well during pregnancy.  Sunbathing:  Use a sunscreen, as skin burns easily during pregnancy.  Drink plenty of fluids; avoid over heating.  Tanning Beds:  Because their possible side effects are still unknown, tanning beds are not recommended.  Ultrasound Scans:  Routine ultrasounds are performed at approximately 20 weeks.  You will be able to see your baby's general anatomy an if you would like to know the gender this can usually be determined as well.  If it is questionable when you conceived you may also receive an ultrasound early in your pregnancy for dating purposes.  Otherwise ultrasound exams are not routinely performed unless there is a medical necessity.  Although you can request a scan we ask that you pay for it when conducted because insurance does not cover " patient request" scans.  Work: If your pregnancy proceeds without complications you may work until your due date, unless your physician or employer advises otherwise.  Round Ligament Pain/Pelvic Discomfort:  Sharp, shooting pains not associated with bleeding are fairly common, usually occurring in the second trimester of pregnancy.  They tend to be worse when standing up or when you remain standing for long periods of time.  These are the result of pressure of certain pelvic ligaments  called "round ligaments".  Rest, Tylenol and heat seem to be the most effective relief.  As the womb and fetus grow, they rise out of the pelvis and the discomfort improves.  Please notify the office if your pain seems different than that described.  It may represent a more serious condition.  https://[email or web address].org/womens-health/faqs/[email or web address]-genetic-[email or web address]-tests">  Prenatal Care Prenatal care is health care during pregnancy. It helps you and your unborn baby (fetus) stay as healthy as possible. Prenatal care may be provided by a midwife, a family practice doctor, a IT consultant (nurse practitioner or physician assistant), or a childbirth and pregnancy doctor (obstetrician). How does this affect me? During pregnancy, you will be closely monitored for any new conditions that might develop. To lower your risk of pregnancy complications, you and yourhealth care provider will talk about any underlying conditions you have. How does this affect my baby? Early and consistent prenatal care increases the chance that your baby will be healthy during pregnancy. Prenatal care lowers the risk that your baby will be:  Born early (prematurely). Smaller than expected at birth (small for gestational age). What can I expect at the first prenatal care visit? Your first prenatal care visit will likely be the longest. You should schedule your first prenatal care visit as soon as you know that you are pregnant. Your first visit is a good time to talk about any questions or concerns you haveabout pregnancy. Medical history At your visit, you and your health care provider will talk about your medical history, including: Any past pregnancies. Your family's medical history. Medical history of the baby's father. Any long-term (chronic) health conditions you have and how you manage them. Any surgeries or procedures you have had. Any current over-the-counter or prescription medicines, herbs, or supplements that  you are taking. Other factors that could pose a risk to your baby, including: Exposure to harmful chemicals or radiation at work or at home. Any substance use, including tobacco, alcohol, and drug use. Your home setting and your stress levels, including: Exposure to abuse or violence. Household financial strain. Your daily health habits, including diet and exercise. Tests and screenings Your health care provider will: Measure your weight, height, and blood pressure. Do a physical exam, including a pelvic and breast exam. Perform blood tests and urine tests to check for: Urinary tract infection. Sexually transmitted infections (STIs). Low iron levels in your blood (anemia). Blood type and certain proteins on red blood cells (Rh antibodies). Infections and immunity to viruses, such as hepatitis B and rubella. HIV (human immunodeficiency virus). Discuss your options for genetic screening. Tips about staying healthy Your health care provider will also give you information about how to keep yourself and your baby healthy, including: Nutrition and taking vitamins. Physical activity. How to manage pregnancy symptoms such as nausea and vomiting (morning sickness). Infections and substances that may be harmful to your baby and how to avoid them. Food safety. Dental care. Working. Travel. Warning signs to watch for and when to call your health care provider. How often will I have prenatal care visits? After your first prenatal care visit, you will have regular visits throughout your pregnancy. The visit schedule is often as follows: Up to week 28 of pregnancy: once every 4 weeks. 28-36 weeks: once every 2 weeks. After 36 weeks: every week until delivery. Some women may have visits more or less often depending on any underlyinghealth conditions and the health of the baby. Keep all follow-up and prenatal care visits. This is important. What happens during routine prenatal care visits? Your  health care provider will: Measure your weight and blood pressure. Check for fetal heart sounds. Measure the height of your uterus in your abdomen (fundal height). This may be measured starting around week 20 of pregnancy. Check the position of your baby inside your uterus. Ask questions about your diet, sleeping patterns, and whether you can feel the baby move. Review warning signs to watch for and signs of labor. Ask about any pregnancy symptoms you are having and how you are dealing with them. Symptoms may include: Headaches. Nausea and vomiting. Vaginal discharge. Swelling. Fatigue. Constipation. Changes in your vision. Feeling persistently sad or anxious. Any discomfort, including back or pelvic pain. Bleeding or spotting. Make a list of questions to ask your health care provider at your routinevisits. What tests might I have during prenatal care visits? You may have blood, urine, and imaging tests throughout your pregnancy, such as: Urine tests to check for glucose, protein, or signs of infection. Glucose tests to check for a  form of diabetes that can develop during pregnancy (gestational diabetes mellitus). This is usually done around week 24 of pregnancy. Ultrasounds to check your baby's growth and development, to check for birth defects, and to check your baby's well-being. These can also help to decide when you should deliver your baby. A test to check for group B strep (GBS) infection. This is usually done around week 36 of pregnancy. Genetic testing. This may include blood, fluid, or tissue sampling, or imaging tests, such as an ultrasound. Some genetic tests are done during the first trimester and some are done during the second trimester. What else can I expect during prenatal care visits? Your health care provider may recommend getting certain vaccines during pregnancy. These may include: A yearly flu shot (annual influenza vaccine). This is especially important if you will  be pregnant during flu season. Tdap (tetanus, diphtheria, pertussis) vaccine. Getting this vaccine during pregnancy can protect your baby from whooping cough (pertussis) after birth. This vaccine may be recommended between weeks 27 and 36 of pregnancy. A COVID-19 vaccine. Later in your pregnancy, your health care provider may give you information about: Childbirth and breastfeeding classes. Choosing a health care provider for your baby. Umbilical cord banking. Breastfeeding. Birth control after your baby is born. The hospital labor and delivery unit and how to set up a tour. Registering at the hospital before you go into labor. Where to find more information Office on Women's Health: LegalWarrants.gl American Pregnancy Association: americanpregnancy.org March of Dimes: marchofdimes.org Summary Prenatal care helps you and your baby stay as healthy as possible during pregnancy. Your first prenatal care visit will most likely be the longest. You will have visits and tests throughout your pregnancy to monitor your health and your baby's health. Bring a list of questions to your visits to ask your health care provider. Make sure to keep all follow-up and prenatal care visits. This information is not intended to replace advice given to you by your health care provider. Make sure you discuss any questions you have with your healthcare provider. Document Revised: 12/20/2019 Document Reviewed: 12/20/2019 Elsevier Patient Education  [place]. Morning Sickness  Morning sickness is when you feel like you may vomit (feel nauseous) during pregnancy. Sometimes, you may vomit. Morning sickness most often happens in the morning, but it can also happen at any time of the day. Some women may have morning sickness that makes them vomit all the time. This is amore serious problem that needs treatment. What are the causes? The cause of this condition is not known. What increases the risk? You had  vomiting or a feeling like you may vomit before your pregnancy. You had morning sickness in another pregnancy. You are pregnant with more than one baby, such as twins. What are the signs or symptoms? Feeling like you may vomit. Vomiting. How is this treated? Treatment is usually not needed for this condition. You may only need to change what you eat. In some cases, your doctor may give you some things to take for your condition. These include: Vitamin B6 supplements. Medicines to treat the feeling that you may vomit. Ginger. Follow these instructions at home: Medicines Take over-the-counter and prescription medicines only as told by your doctor. Do not take any medicines until you talk with your doctor about them first. Take multivitamins before you get pregnant. These can stop or lessen the symptoms of morning sickness. Eating and drinking Eat dry toast or crackers before getting out of bed. Eat 5  or 6 small meals a day. Eat dry and bland foods like rice and baked potatoes. Do not eat greasy, fatty, or spicy foods. Have someone cook for you if the smell of food causes you to vomit or to feel like you may vomit. If you feel like you may vomit after taking prenatal vitamins, take them at night or with a snack. Eat protein foods when you need a snack. Nuts, yogurt, and cheese are good choices. Drink fluids throughout the day. Try ginger ale made with real ginger, ginger tea made from fresh grated ginger, or ginger candies. General instructions Do not smoke or use any products that contain nicotine or tobacco. If you need help quitting, ask your doctor. Use an air purifier to keep the air in your house free of smells. Get lots of fresh air. Try to avoid smells that make you feel sick. Try wearing an acupressure wristband. This is a wristband that is used to treat seasickness. Try a treatment called acupuncture. In this treatment, a doctor puts needles into certain areas of your body to  make you feel better. Contact a doctor if: You need medicine to feel better. You feel dizzy or light-headed. You are losing weight. Get help right away if: The feeling that you may vomit will not go away, or you cannot stop vomiting. You faint. You have very bad pain in your belly. Summary Morning sickness is when you feel like you may vomit (feel nauseous) during pregnancy. You may feel sick in the morning, but you can feel this way at any time of the day. Making some changes to what you eat may help your symptoms go away. This information is not intended to replace advice given to you by your health care provider. Make sure you discuss any questions you have with your healthcare provider. Document Revised: 10/22/2019 Document Reviewed: 10/01/2019 Elsevier Patient Education  [place]. [email or web address].com.[email or web address].html">  First Trimester of Pregnancy  The first trimester of pregnancy starts on the first day of your last menstrual period until the end of week 12. This is also called months 1 through 3 ofpregnancy. Body changes during your first trimester Your body goes through many changes during pregnancy. The changes usuallyreturn to normal after your baby is born. Physical changes You may gain or lose weight. Your breasts may grow larger and hurt. The area around your nipples may get darker. Dark spots or blotches may develop on your face. You may have changes in your hair. Health changes You may feel like you might vomit (nauseous), and you may vomit. You may have heartburn. You may have headaches. You may have trouble pooping (constipation). Your gums may bleed. Other changes You may get tired easily. You may pee (urinate) more often. Your menstrual periods will stop. You may not feel hungry. You may want to eat certain kinds of food. You may have changes in your emotions from day to day. You may have more dreams. Follow these instructions at  home: Medicines Take over-the-counter and prescription medicines only as told by your doctor. Some medicines are not safe during pregnancy. Take a prenatal vitamin that contains at least 600 micrograms (mcg) of folic acid. Eating and drinking Eat healthy meals that include: Fresh fruits and vegetables. Whole grains. Good sources of protein, such as meat, eggs, or tofu. Low-fat dairy products. Avoid raw meat and unpasteurized juice, milk, and cheese. If you feel like you may vomit, or you vomit: Eat 4 or 5 small meals a day  instead of 3 large meals. Try eating a few soda crackers. Drink liquids between meals instead of during meals. You may need to take these actions to prevent or treat trouble pooping: Drink enough fluids to keep your pee (urine) pale yellow. Eat foods that are high in fiber. These include beans, whole grains, and fresh fruits and vegetables. Limit foods that are high in fat and sugar. These include fried or sweet foods. Activity Exercise only as told by your doctor. Most people can do their usual exercise routine during pregnancy. Stop exercising if you have cramps or pain in your lower belly (abdomen) or low back. Do not exercise if it is too hot or too humid, or if you are in a place of great height (high altitude). Avoid heavy lifting. If you choose to, you may have sex unless your doctor tells you not to. Relieving pain and discomfort Wear a good support bra if your breasts are sore. Rest with your legs raised (elevated) if you have leg cramps or low back pain. If you have bulging veins (varicose veins) in your legs: Wear support hose as told by your doctor. Raise your feet for 15 minutes, 3-4 times a day. Limit salt in your food. Safety Wear your seat belt at all times when you are in a car. Talk with your doctor if someone is hurting you or yelling at you. Talk with your doctor if you are feeling sad or have thoughts of hurting yourself. Lifestyle Do not  use hot tubs, steam rooms, or saunas. Do not douche. Do not use tampons or scented sanitary pads. Do not use herbal medicines, illegal drugs, or medicines that are not approved by your doctor. Do not drink alcohol. Do not smoke or use any products that contain nicotine or tobacco. If you need help quitting, ask your doctor. Avoid cat litter boxes and soil that is used by cats. These carry germs that can cause harm to the baby and can cause a loss of your baby by miscarriage or stillbirth. General instructions Keep all follow-up visits. This is important. Ask for help if you need counseling or if you need help with nutrition. Your doctor can give you advice or tell you where to go for help. Visit your dentist. At home, brush your teeth with a soft toothbrush. Floss gently. Write down your questions. Take them to your prenatal visits. Where to find more information American Pregnancy Association: americanpregnancy.org [employer] of Obstetricians and Gynecologists: www.acog.org Office on Women's Health: KeywordPortfolios.com.br Contact a doctor if: You are dizzy. You have a fever. You have mild cramps or pressure in your lower belly. You have a nagging pain in your belly area. You continue to feel like you may vomit, you vomit, or you have watery poop (diarrhea) for 24 hours or longer. You have a bad-smelling fluid coming from your vagina. You have pain when you pee. You are exposed to a disease that spreads from person to person, such as chickenpox, measles, Zika virus, HIV, or hepatitis. Get help right away if: You have spotting or bleeding from your vagina. You have very bad belly cramping or pain. You have shortness of breath or chest pain. You have any kind of injury, such as from a fall or a car crash. You have new or increased pain, swelling, or redness in an arm or leg. Summary The first trimester of pregnancy starts on the first day of your last menstrual period until the  end of week 12 (months 1  through 3). Eat 4 or 5 small meals a day instead of 3 large meals. Do not smoke or use any products that contain nicotine or tobacco. If you need help quitting, ask your doctor. Keep all follow-up visits. This information is not intended to replace advice given to you by your health care provider. Make sure you discuss any questions you have with your healthcare provider. Document Revised: 08/15/2019 Document Reviewed: 06/21/2019 Elsevier Patient Education  [place]. Common Medications Safe in Pregnancy  Acne:      Constipation:  Benzoyl Peroxide     Colace  Clindamycin      Dulcolax Suppository  Topica Erythromycin     Fibercon  Salicylic Acid      Metamucil         Miralax AVOID:        Senakot   Accutane    Cough:  Retin-A       Cough Drops  Tetracycline      Phenergan w/ Codeine if Rx  Minocycline      Robitussin (Plain & DM)  Antibiotics:     Crabs/Lice:  Ceclor       RID  Cephalosporins    AVOID:  E-Mycins      Kwell  Keflex  Macrobid/Macrodantin   Diarrhea:  Penicillin      Kao-Pectate  Zithromax      Imodium AD         PUSH FLUIDS AVOID:       Cipro     Fever:  Tetracycline      Tylenol (Regular or Extra  Minocycline       Strength)  Levaquin      Extra Strength-Do not          Exceed 8 tabs/24 hrs Caffeine:        '200mg'$ /day (equiv. To 1 cup of coffee or  approx. 3 12 oz sodas)         Gas: Cold/Hayfever:       Gas-X  Benadryl      Mylicon  Claritin       Phazyme  **Claritin-D        Chlor-Trimeton    Headaches:  Dimetapp      ASA-Free Excedrin  Drixoral-Non-Drowsy     Cold Compress  Mucinex (Guaifenasin)     Tylenol (Regular or Extra  Sudafed/Sudafed-12 Hour     Strength)  **Sudafed PE Pseudoephedrine   Tylenol Cold & Sinus     Vicks Vapor Rub  Zyrtec  **AVOID if Problems With Blood Pressure         Heartburn: Avoid lying down for at least 1 hour after meals  Aciphex      Maalox     Rash:  Milk of  Magnesia     Benadryl    Mylanta       1% Hydrocortisone Cream  Pepcid  Pepcid Complete   Sleep Aids:  Prevacid      Ambien   Prilosec       Benadryl  Rolaids       Chamomile Tea  Tums (Limit 4/day)     Unisom         Tylenol PM         Warm milk-add vanilla or  Hemorrhoids:       Sugar for taste  Anusol/Anusol H.C.  (RX: Analapram 2.5%)  Sugar Substitutes:  Hydrocortisone OTC     Ok in moderation  Preparation H      Tucks  applied to tissue with wiping    Herpes:     Throat:  Acyclovir      Oragel  Famvir  Valtrex     Vaccines:         Flu Shot Leg Cramps:       *Gardasil  Benadryl      Hepatitis A         Hepatitis B Nasal Spray:       Pneumovax  Saline Nasal Spray     Polio Booster         Tetanus Nausea:       Tuberculosis test or PPD  Vitamin B6 25 mg TID   AVOID:    Dramamine      *Gardasil  Emetrol       Live Poliovirus  Ginger Root 250 mg QID    MMR (measles, mumps &  High Complex Carbs @ Bedtime    rebella)  Sea Bands-Accupressure    Varicella (Chickenpox)  Unisom 1/2 tab TID     *No known complications           If received before Pain:         Known pregnancy;   Darvocet       Resume series after  Lortab        Delivery  Percocet    Yeast:   Tramadol      Femstat  Tylenol 3      Gyne-lotrimin  Ultram       Monistat  Vicodin           MISC:         All Sunscreens           Hair Coloring/highlights          Insect Repellant's          (Including DEET)         Mystic Tans  

## 2020-09-16 LAB — URINALYSIS, ROUTINE W REFLEX MICROSCOPIC
Bilirubin, UA: NEGATIVE
Glucose, UA: NEGATIVE
Leukocytes,UA: NEGATIVE
Nitrite, UA: NEGATIVE
RBC, UA: NEGATIVE
Specific Gravity, UA: >=1.03 — AB (ref 1.005–1.030)
Urobilinogen, Ur: 1 mg/dL (ref 0.2–1.0)
pH, UA: 6 (ref 5.0–7.5)

## 2020-09-16 LAB — GC/CHLAMYDIA PROBE AMP
Chlamydia trachomatis, NAA: NEGATIVE
Neisseria Gonorrhoeae by PCR: NEGATIVE

## 2020-09-17 LAB — URINE CULTURE, OB REFLEX

## 2020-09-17 LAB — CULTURE, OB URINE

## 2020-09-18 LAB — VIRAL HEPATITIS HBV, HCV
HCV Ab: <0.1 s/co ratio (ref 0.0–0.9)
Hep B Core Total Ab: NEGATIVE
Hep B Surface Ab, Qual: REACTIVE
Hepatitis B Surface Ag: NEGATIVE

## 2020-09-18 LAB — RUBELLA SCREEN: Rubella Antibodies, IGG: 5.92 index (ref >0.99)

## 2020-09-18 LAB — AB SCR+ANTIBODY ID: Antibody Screen: POSITIVE — AB

## 2020-09-18 LAB — RPR, QUANT+TP ABS (REFLEX)
Rapid Plasma Reagin, Quant: 1:1 {titer} — ABNORMAL HIGH
T Pallidum Abs: NONREACTIVE

## 2020-09-18 LAB — VARICELLA ZOSTER ANTIBODY, IGG: Varicella zoster IgG: 666 index (ref >165)

## 2020-09-18 LAB — RPR: RPR Ser Ql: REACTIVE — AB

## 2020-09-18 LAB — HCV INTERPRETATION

## 2020-09-18 LAB — HEMOGLOBIN A1C
Est. average glucose Bld gHb Est-mCnc: 120 mg/dL
Hgb A1c MFr Bld: 5.8 % — ABNORMAL HIGH (ref 4.8–5.6)

## 2020-09-18 LAB — TSH: TSH: 0.31 u[IU]/mL — ABNORMAL LOW (ref 0.450–4.500)

## 2020-09-18 LAB — HGB SOLU + RFLX FRAC: Sickle Solubility Test - HGBRFX: NEGATIVE

## 2020-09-18 LAB — ABO AND RH: Rh Factor: POSITIVE

## 2020-09-18 LAB — HIV ANTIBODY (ROUTINE TESTING W REFLEX): HIV Screen 4th Generation wRfx: NONREACTIVE

## 2020-09-18 LAB — ANTIBODY SCREEN

## 2020-09-23 ENCOUNTER — Encounter: Payer: Self-pay | Admitting: Obstetrics and Gynecology

## 2020-09-26 ENCOUNTER — Other Ambulatory Visit: Payer: Self-pay

## 2020-09-26 ENCOUNTER — Other Ambulatory Visit: Payer: Self-pay | Admitting: Surgical

## 2020-09-26 MED ORDER — ONDANSETRON HCL 4 MG PO TABS: 4.0000 mg | ORAL_TABLET | Freq: Three times a day (TID) | ORAL | 0 refills | Status: DC | PRN

## 2020-09-27 LAB — DRUG PROFILE, UR, 9 DRUGS (LABCORP)
Amphetamines, Urine: NEGATIVE ng/mL
Barbiturate Quant, Ur: NEGATIVE ng/mL
Benzodiazepine Quant, Ur: NEGATIVE ng/mL
Cannabinoid Quant, Ur: POSITIVE — AB
Cocaine (Metab.): NEGATIVE ng/mL
Methadone Screen, Urine: NEGATIVE ng/mL
Opiate Quant, Ur: NEGATIVE ng/mL
PCP Quant, Ur: NEGATIVE ng/mL
Propoxyphene: NEGATIVE ng/mL

## 2020-09-27 LAB — NICOTINE SCREEN, URINE: Cotinine Ql Scrn, Ur: NEGATIVE ng/mL

## 2020-09-29 MED ORDER — ONDANSETRON HCL 4 MG PO TABS: 4.0000 mg | ORAL_TABLET | Freq: Three times a day (TID) | ORAL | 0 refills | Status: DC | PRN

## 2020-10-06 ENCOUNTER — Other Ambulatory Visit: Payer: Self-pay | Admitting: Surgical

## 2020-10-06 ENCOUNTER — Other Ambulatory Visit: Payer: Self-pay

## 2020-10-06 MED ORDER — ONDANSETRON HCL 4 MG PO TABS: 4.0000 mg | ORAL_TABLET | Freq: Three times a day (TID) | ORAL | 0 refills | Status: DC | PRN

## 2020-10-07 ENCOUNTER — Encounter: Payer: Self-pay | Admitting: Obstetrics and Gynecology

## 2020-10-07 ENCOUNTER — Ambulatory Visit (INDEPENDENT_AMBULATORY_CARE_PROVIDER_SITE_OTHER): Payer: Commercial Managed Care - PPO | Admitting: Obstetrics and Gynecology

## 2020-10-07 ENCOUNTER — Other Ambulatory Visit: Payer: Self-pay

## 2020-10-07 VITALS — BP 124/79 | HR 87 | Wt 257.0 lb

## 2020-10-07 DIAGNOSIS — R638 Other symptoms and signs concerning food and fluid intake: Secondary | ICD-10-CM

## 2020-10-07 DIAGNOSIS — O161 Unspecified maternal hypertension, first trimester: Secondary | ICD-10-CM

## 2020-10-07 DIAGNOSIS — Z3401 Encounter for supervision of normal first pregnancy, first trimester: Secondary | ICD-10-CM

## 2020-10-07 DIAGNOSIS — Z3A12 12 weeks gestation of pregnancy: Secondary | ICD-10-CM

## 2020-10-07 LAB — POCT URINALYSIS DIPSTICK OB
Bilirubin, UA: NEGATIVE
Blood, UA: NEGATIVE
Glucose, UA: NEGATIVE
Ketones, UA: NEGATIVE
Leukocytes, UA: NEGATIVE
Nitrite, UA: NEGATIVE
Spec Grav, UA: 1.01 (ref 1.010–1.025)
Urobilinogen, UA: 0.2 E.U./dL
pH, UA: 7 (ref 5.0–8.0)

## 2020-10-07 NOTE — Addendum Note (Signed)
Addended by: Durwin Glaze on: 10/07/2020 03:02 PM   Modules accepted: Orders

## 2020-10-07 NOTE — Progress Notes (Signed)
NOB: Still having nausea and vomiting-taking Zofran and trying other strategies as well.  Desires genetic testing  To begin 81 mg aspirin daily  aFP next visit Physical examination General NAD, Conversant  HEENT Atraumatic; Op clear with mmm.  Normo-cephalic. Pupils reactive. Anicteric sclerae  Thyroid/Neck Smooth without nodularity or enlargement. Normal ROM.  Neck Supple.  Skin No rashes, lesions or ulceration. Normal palpated skin turgor. No nodularity.  Breasts: No masses or discharge.  Symmetric.  No axillary adenopathy.  Lungs: Clear to auscultation.No rales or wheezes. Normal Respiratory effort, no retractions.  Heart: NSR.  No murmurs or rubs appreciated. No periferal edema  Abdomen: Soft.  Non-tender.  No masses.  No HSM. No hernia  Extremities: Moves all appropriately.  Normal ROM for age. No lymphadenopathy.  Neuro: Oriented to PPT.  Normal mood. Normal affect.     Pelvic:   Vulva: Normal appearance.  No lesions.  Vagina: No lesions or abnormalities noted.  Support: Normal pelvic support.  Urethra No masses tenderness or scarring.  Meatus Normal size without lesions or prolapse.  Cervix: Normal appearance.  No lesions.  Anus: Normal exam.  No lesions.  Perineum: Normal exam.  No lesions.        Bimanual   Adnexae: No masses.  Non-tender to palpation.  Uterus: Enlarged.  Positive fetal heart tones non-tender.  Mobile.  AV.  Adnexae: No masses.  Non-tender to palpation.  Cul-de-sac: Negative for abnormality.  Adnexae: No masses.  Non-tender to palpation.         Pelvimetry   Diagonal: Reached.  Spines: Average.  Sacrum: Concave.  Pubic Arch: Normal.

## 2020-10-13 LAB — MATERNIT21  PLUS CORE+ESS+SCA, BLOOD

## 2020-10-15 ENCOUNTER — Other Ambulatory Visit: Payer: Self-pay

## 2020-10-20 ENCOUNTER — Telehealth: Payer: Self-pay | Admitting: Obstetrics and Gynecology

## 2020-10-20 MED ORDER — ONDANSETRON HCL 4 MG PO TABS: 4.0000 mg | ORAL_TABLET | Freq: Three times a day (TID) | ORAL | 0 refills | Status: DC | PRN

## 2020-10-20 NOTE — Telephone Encounter (Signed)
Spoke with patient and her Kelly Spence was canceled by [facility]. Patient stated that she would have labs redrawn at her next visit.

## 2020-10-20 NOTE — Telephone Encounter (Signed)
    Please call patient to discuss lab results 

## 2020-11-03 ENCOUNTER — Other Ambulatory Visit: Payer: Self-pay | Admitting: Obstetrics and Gynecology

## 2020-11-05 ENCOUNTER — Other Ambulatory Visit: Payer: Self-pay

## 2020-11-05 ENCOUNTER — Encounter: Payer: Self-pay | Admitting: Obstetrics and Gynecology

## 2020-11-05 ENCOUNTER — Ambulatory Visit (INDEPENDENT_AMBULATORY_CARE_PROVIDER_SITE_OTHER): Payer: Commercial Managed Care - PPO | Admitting: Obstetrics and Gynecology

## 2020-11-05 VITALS — BP 130/84 | HR 97 | Wt 253.7 lb

## 2020-11-05 DIAGNOSIS — Z3A16 16 weeks gestation of pregnancy: Secondary | ICD-10-CM

## 2020-11-05 DIAGNOSIS — O10919 Unspecified pre-existing hypertension complicating pregnancy, unspecified trimester: Secondary | ICD-10-CM

## 2020-11-05 DIAGNOSIS — O0992 Supervision of high risk pregnancy, unspecified, second trimester: Secondary | ICD-10-CM

## 2020-11-05 DIAGNOSIS — Z3689 Encounter for other specified antenatal screening: Secondary | ICD-10-CM

## 2020-11-05 LAB — POCT URINALYSIS DIPSTICK OB
Bilirubin, UA: NEGATIVE
Blood, UA: NEGATIVE
Glucose, UA: NEGATIVE
Ketones, UA: NEGATIVE
Leukocytes, UA: NEGATIVE
Nitrite, UA: NEGATIVE
Spec Grav, UA: >=1.03 — AB (ref 1.010–1.025)
Urobilinogen, UA: 0.2 E.U./dL
pH, UA: 6 (ref 5.0–8.0)

## 2020-11-05 NOTE — Progress Notes (Signed)
    OB-Pt present for routine prenatal care. Pt stated fetal movement present; no contractions present; no vaginal bleeding and no changes in vaginal discharge.  Pt c/o vomiting twice in the morning and nausea throughout the day. Pt stated that Zofran helps some.

## 2020-11-05 NOTE — Progress Notes (Signed)
ROB: Notes nausea and vomiting still present, but has improved some with Zofran. 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  To encourage MJ cessation. RTC in 4 weeks. Anatomy scan ordered.

## 2020-11-05 NOTE — Patient Instructions (Addendum)
Second Trimester of Pregnancy The second trimester of pregnancy is from week 13 through week 27. This is months 4 through 6 of pregnancy. The second trimester is often a time when you feel your best. Your body has adjusted to being pregnant, and you begin to feel better physically. During the second trimester: Morning sickness has lessened or stopped completely. You may have more energy. You may have an increase in appetite. The second trimester is also a time when the unborn baby (fetus) is growing rapidly. At the end of the sixth month, the fetus may be up to 12 inches long and weigh about 1 pounds. You will likely begin to feel the baby move (quickening) between 16 and 20 weeks of pregnancy. Body changes during your second trimester Your body continues to go through many changes during your second trimester. The changes vary and generally return to normal after the baby is born. Physical changes Your weight will continue to increase. You will notice your lower abdomen bulging out. You may begin to get stretch marks on your hips, abdomen, and breasts. Your breasts will continue to grow and to become tender. Dark spots or blotches (chloasma or mask of pregnancy) may develop on your face. A dark line from your belly button to the pubic area (linea nigra) may appear. You may have changes in your hair. These can include thickening of your hair, rapid growth, and changes in texture. Some people also have hair loss during or after pregnancy, or hair that feels dry or thin. Health changes You may develop headaches. You may have heartburn. You may develop constipation. You may develop hemorrhoids or swollen, bulging veins (varicose veins). Your gums may bleed and may be sensitive to brushing and flossing. You may urinate more often because the fetus is pressing on your bladder. You may have back pain. This is caused by: Weight gain. Pregnancy hormones that are relaxing the joints in your  pelvis. A shift in weight and the muscles that support your balance. Follow these instructions at home: Medicines Follow your health care provider's instructions regarding medicine use. Specific medicines may be either safe or unsafe to take during pregnancy. Do not take any medicines unless approved by your health care provider. Take a prenatal vitamin that contains at least 600 micrograms (mcg) of folic acid. Eating and drinking Eat a healthy diet that includes fresh fruits and vegetables, whole grains, good sources of protein such as meat, eggs, or tofu, and low-fat dairy products. Avoid raw meat and unpasteurized juice, milk, and cheese. These carry germs that can harm you and your baby. You may need to take these actions to prevent or treat constipation: Drink enough fluid to keep your urine pale yellow. Eat foods that are high in fiber, such as beans, whole grains, and fresh fruits and vegetables. Limit foods that are high in fat and processed sugars, such as fried or sweet foods. Activity Exercise only as directed by your health care provider. Most people can continue their usual exercise routine during pregnancy. Try to exercise for 30 minutes at least 5 days a week. Stop exercising if you develop contractions in your uterus. Stop exercising if you develop pain or cramping in the lower abdomen or lower back. Avoid exercising if it is very hot or humid or if you are at a high altitude. Avoid heavy lifting. If you choose to, you may have sex unless your health care provider tells you not to. Relieving pain and discomfort Wear a supportive bra   to prevent discomfort from breast tenderness. Take warm sitz baths to soothe any pain or discomfort caused by hemorrhoids. Use hemorrhoid cream if your health care provider approves. Rest with your legs raised (elevated) if you have leg cramps or low back pain. If you develop varicose veins: Wear support hose as told by your health care  provider. Elevate your feet for 15 minutes, 3-4 times a day. Limit salt in your diet. Safety Wear your seat belt at all times when driving or riding in a car. Talk with your health care provider if someone is verbally or physically abusive to you. Lifestyle Do not use hot tubs, steam rooms, or saunas. Do not douche. Do not use tampons or scented sanitary pads. Avoid cat litter boxes and soil used by cats. These carry germs that can cause birth defects in the baby and possibly loss of the fetus by miscarriage or stillbirth. Do not use herbal remedies, alcohol, illegal drugs, or medicines that are not approved by your health care provider. Chemicals in these products can harm your baby. Do not use any products that contain nicotine or tobacco, such as cigarettes, e-cigarettes, and chewing tobacco. If you need help quitting, ask your health care provider. General instructions During a routine prenatal visit, your health care provider will do a physical exam and other tests. He or she will also discuss your overall health. Keep all follow-up visits. This is important. Ask your health care provider for a referral to a local prenatal education class. Ask for help if you have counseling or nutritional needs during pregnancy. Your health care provider can offer advice or refer you to specialists for help with various needs. Where to find more information American Pregnancy Association: americanpregnancy.org American College of Obstetricians and Gynecologists: acog.org/en/Womens%20Health/Pregnancy Office on Women's Health: womenshealth.gov/pregnancy Contact a health care provider if you have: A headache that does not go away when you take medicine. Vision changes or you see spots in front of your eyes. Mild pelvic cramps, pelvic pressure, or nagging pain in the abdominal area. Persistent nausea, vomiting, or diarrhea. A bad-smelling vaginal discharge or foul-smelling urine. Pain when you  urinate. Sudden or extreme swelling of your face, hands, ankles, feet, or legs. A fever. Get help right away if you: Have fluid leaking from your vagina. Have spotting or bleeding from your vagina. Have severe abdominal cramping or pain. Have difficulty breathing. Have chest pain. Have fainting spells. Have not felt your baby move for the time period told by your health care provider. Have new or increased pain, swelling, or redness in an arm or leg. Summary The second trimester of pregnancy is from week 13 through week 27 (months 4 through 6). Do not use herbal remedies, alcohol, illegal drugs, or medicines that are not approved by your health care provider. Chemicals in these products can harm your baby. Exercise only as directed by your health care provider. Most people can continue their usual exercise routine during pregnancy. Keep all follow-up visits. This is important. This information is not intended to replace advice given to you by your health care provider. Make sure you discuss any questions you have with your health care provider. Document Revised: 08/15/2019 Document Reviewed: 06/21/2019 Elsevier Patient Education  2022 Elsevier Inc.    Common Medications Safe in Pregnancy  Acne:      Constipation:  Benzoyl Peroxide     Colace  Clindamycin      Dulcolax Suppository  Topica Erythromycin     Fibercon  Salicylic Acid        Metamucil         Miralax AVOID:        Senakot   Accutane    Cough:  Retin-A       Cough Drops  Tetracycline      Phenergan w/ Codeine if Rx  Minocycline      Robitussin (Plain & DM)  Antibiotics:     Crabs/Lice:  Ceclor       RID  Cephalosporins    AVOID:  E-Mycins      Kwell  Keflex  Macrobid/Macrodantin   Diarrhea:  Penicillin      Kao-Pectate  Zithromax      Imodium AD         PUSH FLUIDS AVOID:       Cipro     Fever:  Tetracycline      Tylenol (Regular or Extra  Minocycline       Strength)  Levaquin      Extra Strength-Do not           Exceed 8 tabs/24 hrs Caffeine:        <240m/day (equiv. To 1 cup of coffee or  approx. 3 12 oz sodas)         Gas: Cold/Hayfever:       Gas-X  Benadryl      Mylicon  Claritin       Phazyme  **Claritin-D        Chlor-Trimeton    Headaches:  Dimetapp      ASA-Free Excedrin  Drixoral-Non-Drowsy     Cold Compress  Mucinex (Guaifenasin)     Tylenol (Regular or Extra  Sudafed/Sudafed-12 Hour     Strength)  **Sudafed PE Pseudoephedrine   Tylenol Cold & Sinus     Vicks Vapor Rub  Zyrtec  **AVOID if Problems With Blood Pressure         Heartburn: Avoid lying down for at least 1 hour after meals  Aciphex      Maalox     Rash:  Milk of Magnesia     Benadryl    Mylanta       1% Hydrocortisone Cream  Pepcid  Pepcid Complete   Sleep Aids:  Prevacid      Ambien   Prilosec       Benadryl  Rolaids       Chamomile Tea  Tums (Limit 4/day)     Unisom         Tylenol PM         Warm milk-add vanilla or  Hemorrhoids:       Sugar for taste  Anusol/Anusol H.C.  (RX: Analapram 2.5%)  Sugar Substitutes:  Hydrocortisone OTC     Ok in moderation  Preparation H      Tucks        Vaseline lotion applied to tissue with wiping    Herpes:     Throat:  Acyclovir      Oragel  Famvir  Valtrex     Vaccines:         Flu Shot Leg Cramps:       *Gardasil  Benadryl      Hepatitis A         Hepatitis B Nasal Spray:       Pneumovax  Saline Nasal Spray     Polio Booster         Tetanus Nausea:       Tuberculosis test or PPD  Vitamin B6 25 mg TID   AVOID:  Dramamine      *Gardasil  Emetrol       Live Poliovirus  Ginger Root 250 mg QID    MMR (measles, mumps &  High Complex Carbs @ Bedtime    rebella)  Sea Bands-Accupressure    Varicella (Chickenpox)  Unisom 1/2 tab TID     *No known complications           If received before Pain:         Known pregnancy;   Darvocet       Resume series after  Lortab        Delivery  Percocet    Yeast:   Tramadol      Femstat  Tylenol  3      Gyne-lotrimin  Ultram       Monistat  Vicodin           MISC:         All Sunscreens           Hair Coloring/highlights          Insect Repellant's          (Including DEET)         Mystic Tans

## 2020-11-07 LAB — AFP, SERUM, OPEN SPINA BIFIDA
AFP MoM: 1.17
AFP Value: 33.8 ng/mL
Gest. Age on Collection Date: 16.2 weeks
Maternal Age At EDD: 30.5 yr
OSBR Risk 1 IN: 10000
Test Results:: NEGATIVE
Weight: 253 [lb_av]

## 2020-11-07 LAB — PROTEIN / CREATININE RATIO, URINE
Creatinine, Urine: 205.8 mg/dL
Protein, Ur: 13.1 mg/dL
Protein/Creat Ratio: 64 mg/g creat (ref 0–200)

## 2020-11-09 LAB — MATERNIT21  PLUS CORE+ESS+SCA, BLOOD

## 2020-11-16 ENCOUNTER — Other Ambulatory Visit: Payer: Self-pay

## 2020-11-18 MED ORDER — ONDANSETRON HCL 4 MG PO TABS: 4.0000 mg | ORAL_TABLET | Freq: Three times a day (TID) | ORAL | 0 refills | Status: DC | PRN

## 2020-12-02 ENCOUNTER — Other Ambulatory Visit: Payer: Self-pay | Admitting: Obstetrics and Gynecology

## 2020-12-02 DIAGNOSIS — Z3689 Encounter for other specified antenatal screening: Secondary | ICD-10-CM

## 2020-12-02 DIAGNOSIS — O10919 Unspecified pre-existing hypertension complicating pregnancy, unspecified trimester: Secondary | ICD-10-CM

## 2020-12-02 DIAGNOSIS — O0992 Supervision of high risk pregnancy, unspecified, second trimester: Secondary | ICD-10-CM

## 2020-12-02 DIAGNOSIS — O99212 Obesity complicating pregnancy, second trimester: Secondary | ICD-10-CM

## 2020-12-04 ENCOUNTER — Other Ambulatory Visit: Payer: Self-pay

## 2020-12-04 ENCOUNTER — Ambulatory Visit: Payer: Commercial Managed Care - PPO | Attending: Maternal & Fetal Medicine

## 2020-12-04 DIAGNOSIS — O10912 Unspecified pre-existing hypertension complicating pregnancy, second trimester: Secondary | ICD-10-CM | POA: Diagnosis present

## 2020-12-04 DIAGNOSIS — Z3689 Encounter for other specified antenatal screening: Secondary | ICD-10-CM | POA: Diagnosis not present

## 2020-12-04 DIAGNOSIS — O10012 Pre-existing essential hypertension complicating pregnancy, second trimester: Secondary | ICD-10-CM | POA: Diagnosis not present

## 2020-12-04 DIAGNOSIS — O99212 Obesity complicating pregnancy, second trimester: Secondary | ICD-10-CM | POA: Insufficient documentation

## 2020-12-04 DIAGNOSIS — Z3A20 20 weeks gestation of pregnancy: Secondary | ICD-10-CM

## 2020-12-04 DIAGNOSIS — O0992 Supervision of high risk pregnancy, unspecified, second trimester: Secondary | ICD-10-CM | POA: Insufficient documentation

## 2020-12-04 DIAGNOSIS — E669 Obesity, unspecified: Secondary | ICD-10-CM | POA: Insufficient documentation

## 2020-12-04 DIAGNOSIS — O10919 Unspecified pre-existing hypertension complicating pregnancy, unspecified trimester: Secondary | ICD-10-CM

## 2020-12-04 DIAGNOSIS — Z3A00 Weeks of gestation of pregnancy not specified: Secondary | ICD-10-CM | POA: Insufficient documentation

## 2020-12-08 ENCOUNTER — Encounter: Payer: Self-pay | Admitting: Obstetrics and Gynecology

## 2020-12-08 ENCOUNTER — Ambulatory Visit (INDEPENDENT_AMBULATORY_CARE_PROVIDER_SITE_OTHER): Payer: Commercial Managed Care - PPO | Admitting: Obstetrics and Gynecology

## 2020-12-08 ENCOUNTER — Other Ambulatory Visit: Payer: Self-pay

## 2020-12-08 VITALS — BP 114/72 | HR 78 | Wt 260.8 lb

## 2020-12-08 DIAGNOSIS — O0992 Supervision of high risk pregnancy, unspecified, second trimester: Secondary | ICD-10-CM

## 2020-12-08 DIAGNOSIS — Z3A21 21 weeks gestation of pregnancy: Secondary | ICD-10-CM

## 2020-12-08 MED ORDER — ONDANSETRON HCL 4 MG PO TABS: 4.0000 mg | ORAL_TABLET | Freq: Three times a day (TID) | ORAL | 0 refills | Status: DC | PRN

## 2020-12-08 NOTE — Progress Notes (Signed)
ROB: No problems.  Taking aspirin as directed.  Blood pressure is good.  Has follow-up scheduled with MFM -report not back yet (possibly incomplete anatomy?)

## 2020-12-08 NOTE — Progress Notes (Signed)
   OB-Pt present for routine prenatal care. Pt stated fetal movement present; no contractions present; no vaginal bleeding and no changes in vaginal discharge.     Pt c/o lower abd pain and n/v.  Pt will get flu vaccine at another visit.

## 2020-12-22 ENCOUNTER — Ambulatory Visit: Payer: Commercial Managed Care - PPO | Admitting: Podiatry

## 2020-12-30 ENCOUNTER — Other Ambulatory Visit: Payer: Self-pay

## 2020-12-30 DIAGNOSIS — Z3A21 21 weeks gestation of pregnancy: Secondary | ICD-10-CM

## 2020-12-30 MED ORDER — ONDANSETRON HCL 4 MG PO TABS: 4.0000 mg | ORAL_TABLET | Freq: Three times a day (TID) | ORAL | 0 refills | Status: DC | PRN

## 2021-01-06 ENCOUNTER — Encounter: Payer: Self-pay | Admitting: Obstetrics and Gynecology

## 2021-01-06 ENCOUNTER — Ambulatory Visit (INDEPENDENT_AMBULATORY_CARE_PROVIDER_SITE_OTHER): Payer: Commercial Managed Care - PPO | Admitting: Obstetrics and Gynecology

## 2021-01-06 ENCOUNTER — Other Ambulatory Visit: Payer: Self-pay

## 2021-01-06 VITALS — BP 118/79 | HR 73 | Wt 269.7 lb

## 2021-01-06 DIAGNOSIS — Z3402 Encounter for supervision of normal first pregnancy, second trimester: Secondary | ICD-10-CM

## 2021-01-06 DIAGNOSIS — Z3A25 25 weeks gestation of pregnancy: Secondary | ICD-10-CM

## 2021-01-06 DIAGNOSIS — Z23 Encounter for immunization: Secondary | ICD-10-CM

## 2021-01-06 DIAGNOSIS — Z131 Encounter for screening for diabetes mellitus: Secondary | ICD-10-CM

## 2021-01-06 DIAGNOSIS — Z113 Encounter for screening for infections with a predominantly sexual mode of transmission: Secondary | ICD-10-CM

## 2021-01-06 LAB — POCT URINALYSIS DIPSTICK OB
Bilirubin, UA: NEGATIVE
Blood, UA: NEGATIVE
Glucose, UA: NEGATIVE
Ketones, UA: NEGATIVE
Leukocytes, UA: NEGATIVE
Nitrite, UA: NEGATIVE
Spec Grav, UA: 1.01 (ref 1.010–1.025)
Urobilinogen, UA: 0.2 E.U./dL
pH, UA: 7.5 (ref 5.0–8.0)

## 2021-01-06 NOTE — Progress Notes (Signed)
ROB: Patient complains of headaches and left flank pain x 2 days. UA negative today. Thinks headache is due to stress. Notes that her job is stressful (works as Radio broadcast assistant at [employer]). Is noting swelling in her feet. Also is getting nauseated by the smell. Wonders if she can be taken out of work and use FMLA as she talked with her job and gets 12 weeks. Discussed that FMLA is typically used for medical indications.  Patient upset with the news, however I did discuss other options including use of intermittent FMLA, getting letter to reduce physical duties (or even hour reduction, as much as is allowed for an Radio broadcast assistant), use of compression stockings at work, Social research officer, government.  Medical laboratory scientific officer. RTC in 4 weeks, for 1 hr glucola at that time.

## 2021-01-06 NOTE — Progress Notes (Signed)
ROB: She has been having headaches x 2 days. She also complains of left flank pain that comes and goes.

## 2021-01-06 NOTE — Patient Instructions (Signed)

## 2021-01-13 ENCOUNTER — Other Ambulatory Visit: Payer: Self-pay

## 2021-01-13 DIAGNOSIS — O99212 Obesity complicating pregnancy, second trimester: Secondary | ICD-10-CM

## 2021-01-13 DIAGNOSIS — O10919 Unspecified pre-existing hypertension complicating pregnancy, unspecified trimester: Secondary | ICD-10-CM

## 2021-01-15 ENCOUNTER — Other Ambulatory Visit: Payer: Self-pay

## 2021-01-15 ENCOUNTER — Ambulatory Visit: Payer: Commercial Managed Care - PPO | Attending: Maternal & Fetal Medicine

## 2021-01-15 DIAGNOSIS — Z3A26 26 weeks gestation of pregnancy: Secondary | ICD-10-CM | POA: Insufficient documentation

## 2021-01-15 DIAGNOSIS — O99212 Obesity complicating pregnancy, second trimester: Secondary | ICD-10-CM | POA: Insufficient documentation

## 2021-01-15 DIAGNOSIS — O10912 Unspecified pre-existing hypertension complicating pregnancy, second trimester: Secondary | ICD-10-CM | POA: Diagnosis not present

## 2021-01-15 DIAGNOSIS — O10919 Unspecified pre-existing hypertension complicating pregnancy, unspecified trimester: Secondary | ICD-10-CM

## 2021-01-15 DIAGNOSIS — O10012 Pre-existing essential hypertension complicating pregnancy, second trimester: Secondary | ICD-10-CM | POA: Diagnosis not present

## 2021-01-15 DIAGNOSIS — E669 Obesity, unspecified: Secondary | ICD-10-CM | POA: Diagnosis not present

## 2021-01-15 DIAGNOSIS — Z363 Encounter for antenatal screening for malformations: Secondary | ICD-10-CM | POA: Insufficient documentation

## 2021-02-03 ENCOUNTER — Other Ambulatory Visit: Payer: Self-pay

## 2021-02-03 ENCOUNTER — Ambulatory Visit (INDEPENDENT_AMBULATORY_CARE_PROVIDER_SITE_OTHER): Payer: Commercial Managed Care - PPO | Admitting: Obstetrics and Gynecology

## 2021-02-03 ENCOUNTER — Other Ambulatory Visit: Payer: Commercial Managed Care - PPO

## 2021-02-03 ENCOUNTER — Encounter: Payer: Self-pay | Admitting: Obstetrics and Gynecology

## 2021-02-03 VITALS — BP 115/79 | HR 90 | Wt 274.6 lb

## 2021-02-03 DIAGNOSIS — Z23 Encounter for immunization: Secondary | ICD-10-CM | POA: Diagnosis not present

## 2021-02-03 DIAGNOSIS — Z113 Encounter for screening for infections with a predominantly sexual mode of transmission: Secondary | ICD-10-CM

## 2021-02-03 DIAGNOSIS — Z3402 Encounter for supervision of normal first pregnancy, second trimester: Secondary | ICD-10-CM

## 2021-02-03 DIAGNOSIS — Z131 Encounter for screening for diabetes mellitus: Secondary | ICD-10-CM

## 2021-02-03 DIAGNOSIS — Z3A29 29 weeks gestation of pregnancy: Secondary | ICD-10-CM

## 2021-02-03 LAB — POCT URINALYSIS DIPSTICK OB
Bilirubin, UA: NEGATIVE
Blood, UA: NEGATIVE
Glucose, UA: NEGATIVE
Ketones, UA: NEGATIVE
Nitrite, UA: NEGATIVE
Spec Grav, UA: 1.02 (ref 1.010–1.025)
Urobilinogen, UA: 0.2 E.U./dL
pH, UA: 7 (ref 5.0–8.0)

## 2021-02-03 NOTE — Progress Notes (Signed)
ROB: She is doing well. Has concerns about growth and why she has to have ultrasound done monthly.

## 2021-02-03 NOTE — Progress Notes (Signed)
ROB: No problems.  Feels better-"finally stopped being sick".  1 hour GCT today.  Follow-up ultrasound with MFM scheduled in 2 weeks.  Last ultrasound with MFM reviewed directly with the patient.

## 2021-02-03 NOTE — Addendum Note (Signed)
Addended by: Chilton Greathouse on: 02/03/2021 12:11 PM   Modules accepted: Orders

## 2021-02-04 ENCOUNTER — Other Ambulatory Visit: Payer: Self-pay | Admitting: Obstetrics and Gynecology

## 2021-02-04 LAB — CBC
Hematocrit: 31.8 % — ABNORMAL LOW (ref 34.0–46.6)
Hemoglobin: 10.7 g/dL — ABNORMAL LOW (ref 11.1–15.9)
MCH: 27.9 pg (ref 26.6–33.0)
MCHC: 33.6 g/dL (ref 31.5–35.7)
MCV: 83 fL (ref 79–97)
Platelets: 221 10*3/uL (ref 150–450)
RBC: 3.83 x10E6/uL (ref 3.77–5.28)
RDW: 14.8 % (ref 11.7–15.4)
WBC: 8.4 10*3/uL (ref 3.4–10.8)

## 2021-02-04 LAB — RPR: RPR Ser Ql: NONREACTIVE

## 2021-02-04 LAB — GLUCOSE, 1 HOUR GESTATIONAL: Gestational Diabetes Screen: 93 mg/dL (ref 70–139)

## 2021-02-04 MED ORDER — FERROUS SULFATE 325 (65 FE) MG PO TABS: 325.0000 mg | ORAL_TABLET | Freq: Every day | ORAL | 1 refills | Status: AC

## 2021-02-11 ENCOUNTER — Encounter: Payer: Self-pay | Admitting: Obstetrics and Gynecology

## 2021-02-13 ENCOUNTER — Emergency Department: Payer: Commercial Managed Care - PPO

## 2021-02-13 ENCOUNTER — Other Ambulatory Visit: Payer: Self-pay

## 2021-02-13 ENCOUNTER — Encounter: Payer: Self-pay | Admitting: Emergency Medicine

## 2021-02-13 ENCOUNTER — Emergency Department
Admission: EM | Admit: 2021-02-13 | Discharge: 2021-02-13 | Disposition: A | Discharge status: Home / Self Care | Payer: Commercial Managed Care - PPO | Attending: Emergency Medicine | Admitting: Emergency Medicine

## 2021-02-13 DIAGNOSIS — J101 Influenza due to other identified influenza virus with other respiratory manifestations: Secondary | ICD-10-CM | POA: Insufficient documentation

## 2021-02-13 DIAGNOSIS — I10 Essential (primary) hypertension: Secondary | ICD-10-CM | POA: Diagnosis not present

## 2021-02-13 DIAGNOSIS — Z7982 Long term (current) use of aspirin: Secondary | ICD-10-CM | POA: Insufficient documentation

## 2021-02-13 DIAGNOSIS — Z87891 Personal history of nicotine dependence: Secondary | ICD-10-CM | POA: Diagnosis not present

## 2021-02-13 DIAGNOSIS — Z20822 Contact with and (suspected) exposure to covid-19: Secondary | ICD-10-CM | POA: Diagnosis not present

## 2021-02-13 DIAGNOSIS — R059 Cough, unspecified: Secondary | ICD-10-CM | POA: Diagnosis present

## 2021-02-13 LAB — RESP PANEL BY RT-PCR (FLU A&B, COVID) ARPGX2
Influenza A by PCR: POSITIVE — AB
Influenza B by PCR: NEGATIVE
SARS Coronavirus 2 by RT PCR: NEGATIVE

## 2021-02-13 MED ORDER — ACETAMINOPHEN 500 MG PO TABS
1000.0000 mg | ORAL_TABLET | Freq: Once | ORAL | Status: AC
  Administered 2021-02-13: 1000 mg via ORAL
  Filled 2021-02-13: qty 2

## 2021-02-13 MED ORDER — OSELTAMIVIR PHOSPHATE 75 MG PO CAPS: 75.0000 mg | ORAL_CAPSULE | Freq: Two times a day (BID) | ORAL | 0 refills | Status: AC

## 2021-02-13 MED ORDER — DOXYLAMINE SUCCINATE (SLEEP) 25 MG PO TABS: 25.0000 mg | ORAL_TABLET | Freq: Every evening | ORAL | 0 refills | Status: DC | PRN

## 2021-02-13 NOTE — ED Provider Notes (Signed)
[facility] Emergency Department Provider Note  ____________________________________________   Event Date/Time   First MD Initiated Contact with Patient 02/13/21 1044     (approximate)  I have reviewed the triage vital signs and the nursing notes.   HISTORY  Chief Complaint Cough   HPI Kelly Spence is a 30 y.o. female with past medical history of cough and currently pregnant approximately 31 weeks with uncomplicated pregnancy thus far followed by OB who presents for assessment of cough associate with myalgias fever that began yesterday.  No hemoptysis, vomiting, burning with urination, diarrhea, rash or focal pain including in the extremities.  Patient states everywhere the same including in the chest, back, abdomen and head.  No other acute concerns at this time.         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 Authorizing Provider  doxylamine, Sleep, (UNISOM) 25 MG tablet Take 1 tablet (25 mg total) by mouth at bedtime as needed for up to 5 days for sleep. 02/13/21 02/18/21 Yes Lucrezia Starch, MD  oseltamivir (TAMIFLU) 75 MG capsule Take 1 capsule (75 mg total) by mouth 2 (two) times daily for 5 days. 02/13/21 02/18/21 Yes Lucrezia Starch, MD  aspirin EC 81 MG tablet Take 81 mg by mouth daily. Swallow whole.    [provider]  clobetasol ointment (TEMOVATE) 0.05 % Apply topically 2 (two) times daily. 05/27/20   [provider]  ferrous sulfate (FERROUSUL) 325 (65 FE) MG tablet Take 1 tablet (325 mg  total) by mouth daily with breakfast. 02/04/21   [name], MD  ondansetron (ZOFRAN) 4 MG tablet Take 1 tablet (4 mg total) by mouth every 8 (eight) hours as needed for nausea or vomiting. 12/30/20   [name], MD  Prenatal Vit-Fe Fumarate-FA (MULTIVITAMIN-PRENATAL) 27-0.8 MG TABS tablet Take 1 tablet by mouth daily at 12 noon.    [provider]    Allergies Chocolate and Nickel  Family History  Problem Relation Age of Onset   Hypertension Mother    Hypertension Father    Lung disease Father    COPD Father    Cancer Paternal Uncle    Diabetes Maternal Grandmother    Stroke Maternal Grandmother    Aneurysm Maternal Grandfather    Cancer Paternal Grandmother    Parkinson's disease Paternal Grandmother    Pancreatic cancer Paternal Grandfather    Seizures Cousin     Social History Social History   Tobacco Use   Smoking status: Former    Packs/day: 0.10    Types: Cigarettes   Smokeless tobacco: Never  Vaping Use   Vaping Use: Never used  Substance Use Topics   Alcohol use: No   Drug use: Not Currently    Types: Marijuana    Review of Systems  Review of Systems  Constitutional:  Positive for chills, fever and malaise/fatigue.  HENT:  Negative for sore throat.   Eyes:  Negative for pain.  Respiratory:  Positive for cough. Negative for  stridor.   Cardiovascular:  Positive for chest pain (when coughing).  Gastrointestinal:  Negative for vomiting.  Musculoskeletal:  Positive for myalgias.  Skin:  Negative for rash.  Neurological:  Negative for seizures, loss of consciousness and headaches.  Psychiatric/Behavioral:  Negative for suicidal ideas.   All other systems reviewed and are negative.    ____________________________________________   PHYSICAL EXAM:  VITAL SIGNS: ED Triage Vitals  Enc Vitals Group     BP 02/13/21 0933 140/88     Pulse Rate 02/13/21 0933 (!) 112     Resp 02/13/21 0933 20     Temp 02/13/21 0933 100.1 F (37.8 C)     Temp  Source 02/13/21 0933 Oral     SpO2 02/13/21 0933 100 %     Weight 02/13/21 0926 275 lb (124.7 kg)     Height 02/13/21 0926 5\' 10"  (1.778 m)     Head Circumference --      Peak Flow --      Pain Score 02/13/21 0926 0     Pain Loc --      Pain Edu? --      Excl. in [place]? --    Vitals:   02/13/21 0933 02/13/21 1100  BP: 140/88 125/74  Pulse: (!) 112 (!) 111  Resp: 20 18  Temp: 100.1 F (37.8 C)   SpO2: 100% 98%   Physical Exam Vitals and nursing note reviewed.  Constitutional:      General: She is not in acute distress.    Appearance: She is well-developed.  HENT:     Head: Normocephalic and atraumatic.     Right Ear: External ear normal.     Left Ear: External ear normal.     Nose: Nose normal.  Eyes:     Conjunctiva/sclera: Conjunctivae normal.  Cardiovascular:     Rate and Rhythm: Normal rate and regular rhythm.     Heart sounds: No murmur heard. Pulmonary:     Effort: Pulmonary effort is normal. No respiratory distress.     Breath sounds: Normal breath sounds.  Abdominal:     Palpations: Abdomen is soft.     Tenderness: There is no abdominal tenderness.  Musculoskeletal:        General: No swelling.     Cervical back: Neck supple.  Skin:    General: Skin is warm and dry.     Capillary Refill: Capillary refill takes less than 2 seconds.  Neurological:     Mental Status: She is alert.  Psychiatric:        Mood and Affect: Mood normal.    Gravid abdomen.  Oropharynx is erythematous without evidence of exudates uvular deviation or tonsillar enlargement. ____________________________________________   LABS (all labs ordered are listed, but only abnormal results are displayed)  Labs Reviewed  RESP PANEL BY RT-PCR (FLU A&B, COVID) ARPGX2 - Abnormal; Notable for the following components:      Result Value   Influenza A by PCR POSITIVE (*)    All other components within normal limits   ____________________________________________  EKG  KG shows sinus  tachycardia with a ventricular rate of 111, normal axis, unremarkable intervals without evidence of acute ischemia other than nonspecific change in lead III. ____________________________________________  RADIOLOGY  ED MD interpretation: Chest x-ray shows no focal consolidation, effusion, edema, pneumothorax or any other clear acute thoracic process.  Official radiology report(s): DG Chest 2 View  Result Date: 02/13/2021 CLINICAL DATA:  Chest pain and cough. EXAM: CHEST - 2 VIEW  COMPARISON:  None. FINDINGS: The heart size and mediastinal contours are within normal limits. Both lungs are clear. No pleural effusion or pneumothorax. The visualized skeletal structures are unremarkable. IMPRESSION: No active cardiopulmonary disease. Electronically Signed   By: Lajean Manes M.D.   On: 02/13/2021 11:11    ____________________________________________   PROCEDURES  Procedure(s) performed (including Critical Care):  Procedures   ____________________________________________   INITIAL IMPRESSION / ASSESSMENT AND PLAN / ED COURSE      Patient presents with above-stated history exam for assessment of fevers, chills, cough and myalgias that started yesterday.  On arrival patient is slight tachycardic at 111 with a temperature of 100.1 with otherwise stable vital signs on room air.  She does not appear in significant respiratory distress and her lungs are clear bilaterally.  Her abdomen is soft nontender throughout and she is clearly gravid.  There is no significant CVA tenderness.  Overall history exam is most consistent with acute viral process.  COVID influenza PCR sent.  Given absence of significant respiratory stress, hypoxia, abnormal breath sounds or focal consolidation on chest x-ray as well as suspicion for bacterial pneumonia.  ECG is not suggestive of ACS or myocarditis.  Patient otherwise does not appear significantly dehydrated and has been tolerating adequate p.o. without significant  difficulty.  Chest x-ray rest x-ray is otherwise unremarkable.  Influenza PCR is positive.  Discussed this with patient.  Will write Rx for Tamiflu.  Advised taking Tylenol 1 g every 6 hours for body aches and fevers.  She is also requesting some doxylamine for insomnia she will give a short course of this.  Low suspicion for other immediate life-threatening process or significant metabolic derangement.  Discharged in stable condition.  Strict return precautions advised discussed.  Emphasized importance of close follow-up with her PCP and OB.       ____________________________________________   FINAL CLINICAL IMPRESSION(S) / ED DIAGNOSES  Final diagnoses:  Influenza A    Medications  acetaminophen (TYLENOL) tablet 1,000 mg (1,000 mg Oral Given 02/13/21 1118)     ED Discharge Orders          Ordered    oseltamivir (TAMIFLU) 75 MG capsule  2 times daily        02/13/21 1218    doxylamine, Sleep, (UNISOM) 25 MG tablet  At bedtime PRN        02/13/21 1220             Note:  This document was prepared using Dragon voice recognition software and may include unintentional dictation errors.    Lucrezia Starch, MD 02/13/21 [phone]

## 2021-02-13 NOTE — ED Triage Notes (Signed)
Pt reports started with a cough yesterday and wants to make sure she gets ahead of anything because she is [redacted] weeks pregnant. Pt reports cough is not productive, denies SOB, fever, chills or other sx's.

## 2021-02-16 NOTE — Telephone Encounter (Signed)
Called patient to inform her on what she could take for her cough and sore throat. She had to go to ED and was dx with Influenza Type A. She was given Tamiflu and she said she is feeling much better.

## 2021-02-17 ENCOUNTER — Other Ambulatory Visit: Payer: Self-pay

## 2021-02-17 DIAGNOSIS — O99213 Obesity complicating pregnancy, third trimester: Secondary | ICD-10-CM

## 2021-02-17 DIAGNOSIS — O10919 Unspecified pre-existing hypertension complicating pregnancy, unspecified trimester: Secondary | ICD-10-CM

## 2021-02-17 DIAGNOSIS — O99320 Drug use complicating pregnancy, unspecified trimester: Secondary | ICD-10-CM

## 2021-02-19 ENCOUNTER — Other Ambulatory Visit: Payer: Self-pay

## 2021-02-19 ENCOUNTER — Ambulatory Visit: Payer: Commercial Managed Care - PPO | Attending: Obstetrics and Gynecology

## 2021-02-19 DIAGNOSIS — O99320 Drug use complicating pregnancy, unspecified trimester: Secondary | ICD-10-CM

## 2021-02-19 DIAGNOSIS — O99323 Drug use complicating pregnancy, third trimester: Secondary | ICD-10-CM | POA: Insufficient documentation

## 2021-02-19 DIAGNOSIS — O99213 Obesity complicating pregnancy, third trimester: Secondary | ICD-10-CM | POA: Insufficient documentation

## 2021-02-19 DIAGNOSIS — O10913 Unspecified pre-existing hypertension complicating pregnancy, third trimester: Secondary | ICD-10-CM | POA: Insufficient documentation

## 2021-02-19 DIAGNOSIS — Z363 Encounter for antenatal screening for malformations: Secondary | ICD-10-CM | POA: Insufficient documentation

## 2021-02-19 DIAGNOSIS — O10013 Pre-existing essential hypertension complicating pregnancy, third trimester: Secondary | ICD-10-CM | POA: Diagnosis not present

## 2021-02-19 DIAGNOSIS — F1290 Cannabis use, unspecified, uncomplicated: Secondary | ICD-10-CM | POA: Diagnosis not present

## 2021-02-19 DIAGNOSIS — E669 Obesity, unspecified: Secondary | ICD-10-CM | POA: Insufficient documentation

## 2021-02-19 DIAGNOSIS — Z3A31 31 weeks gestation of pregnancy: Secondary | ICD-10-CM | POA: Insufficient documentation

## 2021-02-19 DIAGNOSIS — O10919 Unspecified pre-existing hypertension complicating pregnancy, unspecified trimester: Secondary | ICD-10-CM

## 2021-03-03 ENCOUNTER — Other Ambulatory Visit: Payer: Self-pay

## 2021-03-03 ENCOUNTER — Ambulatory Visit (INDEPENDENT_AMBULATORY_CARE_PROVIDER_SITE_OTHER): Payer: Commercial Managed Care - PPO | Admitting: Obstetrics and Gynecology

## 2021-03-03 ENCOUNTER — Encounter: Payer: Self-pay | Admitting: Obstetrics and Gynecology

## 2021-03-03 VITALS — BP 129/82 | Wt 281.8 lb

## 2021-03-03 DIAGNOSIS — Z3403 Encounter for supervision of normal first pregnancy, third trimester: Secondary | ICD-10-CM

## 2021-03-03 DIAGNOSIS — O10919 Unspecified pre-existing hypertension complicating pregnancy, unspecified trimester: Secondary | ICD-10-CM

## 2021-03-03 DIAGNOSIS — Z3A33 33 weeks gestation of pregnancy: Secondary | ICD-10-CM

## 2021-03-03 DIAGNOSIS — O0993 Supervision of high risk pregnancy, unspecified, third trimester: Secondary | ICD-10-CM

## 2021-03-03 LAB — POCT URINALYSIS DIPSTICK OB
Bilirubin, UA: NEGATIVE
Blood, UA: NEGATIVE
Glucose, UA: NEGATIVE
Ketones, UA: NEGATIVE
Leukocytes, UA: NEGATIVE
Nitrite, UA: NEGATIVE
Spec Grav, UA: 1.025 (ref 1.010–1.025)
Urobilinogen, UA: 0.2 E.U./dL
pH, UA: 6.5 (ref 5.0–8.0)

## 2021-03-03 NOTE — Progress Notes (Signed)
ROB: Patient notes occasional sharp pains on her right side. Denies any other associated symptoms.  Last gorwth scan notes growth at 14%ile, for repeat at end of the month. Discussed BMI (now >40), indications for IOL and antenatal testing. However will need all of this for cHTN in pregnancy as well.

## 2021-03-03 NOTE — Patient Instructions (Signed)
Third Trimester of Pregnancy The third trimester of pregnancy is from week 28 through week 32. This is months 7 through 9. The third trimester is a time when the unborn baby (fetus) is growing rapidly. At the end of the ninth month, the fetus is about 20 inches long and weighs 6-10 pounds. Body changes during your third trimester During the third trimester, your body will continue to go through many changes. The changes vary and generally return to normal after your baby is born. Physical changes Your weight will continue to increase. You can expect to gain 25-35 pounds (11-16 kg) by the end of the pregnancy if you begin pregnancy at a normal weight. If you are underweight, you can expect to gain 28-40 lb (about 13-18 kg), and if you are overweight, you can expect to gain 15-25 lb (about 7-11 kg). You may begin to get stretch marks on your hips, abdomen, and breasts. Your breasts will continue to grow and may hurt. A yellow fluid (colostrum) may leak from your breasts. This is the first milk you are producing for your baby. You may have changes in your hair. These can include thickening of your hair, rapid growth, and changes in texture. Some people also have hair loss during or after pregnancy, or hair that feels dry or thin. Your belly button may stick out. You may notice more swelling in your hands, face, or ankles. Health changes You may have heartburn. You may have constipation. You may develop hemorrhoids. You may develop swollen, bulging veins (varicose veins) in your legs. You may have increased body aches in the pelvis, back, or thighs. This is due to weight gain and increased hormones that are relaxing your joints. You may have increased tingling or numbness in your hands, arms, and legs. The skin on your abdomen may also feel numb. You may feel short of breath because of your expanding uterus. Other changes You may urinate more often because the fetus is moving lower into your pelvis  and pressing on your bladder. You may have more problems sleeping. This may be caused by the size of your abdomen, an increased need to urinate, and an increase in your body's metabolism. You may notice the fetus "dropping," or moving lower in your abdomen (lightening). You may have increased vaginal discharge. You may notice that you have pain around your pelvic bone as your uterus distends. Follow these instructions at home: Medicines Follow your health care provider's instructions regarding medicine use. Specific medicines may be either safe or unsafe to take during pregnancy. Do not take any medicines unless approved by your health care provider. Take a prenatal vitamin that contains at least 600 micrograms (mcg) of folic acid. Eating and drinking Eat a healthy diet that includes fresh fruits and vegetables, whole grains, good sources of protein such as meat, eggs, or tofu, and low-fat dairy products. Avoid raw meat and unpasteurized juice, milk, and cheese. These carry germs that can harm you and your baby. Eat 4 or 5 small meals rather than 3 large meals a day. You may need to take these actions to prevent or treat constipation: Drink enough fluid to keep your urine pale yellow. Eat foods that are high in fiber, such as beans, whole grains, and fresh fruits and vegetables. Limit foods that are high in fat and processed sugars, such as fried or sweet foods. Activity Exercise only as directed by your health care provider. Most people can continue their usual exercise routine during pregnancy. Try to  exercise for 30 minutes at least 5 days a week. Stop exercising if you experience contractions in the uterus. Stop exercising if you develop pain or cramping in the lower abdomen or lower back. Avoid heavy lifting. Do not exercise if it is very hot or humid or if you are at a high altitude. If you choose to, you may continue to have sex unless your health care provider tells you not  to. Relieving pain and discomfort Take frequent breaks and rest with your legs raised (elevated) if you have leg cramps or low back pain. Take warm sitz baths to soothe any pain or discomfort caused by hemorrhoids. Use hemorrhoid cream if your health care provider approves. Wear a supportive bra to prevent discomfort from breast tenderness. If you develop varicose veins: Wear support hose as told by your health care provider. Elevate your feet for 15 minutes, 3-4 times a day. Limit salt in your diet. Safety Talk to your health care provider before traveling far distances. Do not use hot tubs, steam rooms, or saunas. Wear your seat belt at all times when driving or riding in a car. Talk with your health care provider if someone is verbally or physically abusive to you. Preparing for birth To prepare for the arrival of your baby: Take prenatal classes to understand, practice, and ask questions about labor and delivery. Visit the hospital and tour the maternity area. Purchase a rear-facing car seat and make sure you know how to install it in your car. Prepare the baby's room or sleeping area. Make sure to remove all pillows and stuffed animals from the baby's crib to prevent suffocation. General instructions Avoid cat litter boxes and soil used by cats. These carry germs that can cause birth defects in the baby. If you have a cat, ask someone to clean the litter box for you. Do not douche or use tampons. Do not use scented sanitary pads. Do not use any products that contain nicotine or tobacco, such as cigarettes, e-cigarettes, and chewing tobacco. If you need help quitting, ask your health care provider. Do not use any herbal remedies, illegal drugs, or medicines that were not prescribed to you. Chemicals in these products can harm your baby. Do not drink alcohol. You will have more frequent prenatal exams during the third trimester. During a routine prenatal visit, your health care provider  will do a physical exam, perform tests, and discuss your overall health. Keep all follow-up visits. This is important. Where to find more information American Pregnancy Association: americanpregnancy.[place] and Gynecologists: PoolDevices.com.pt Office on [employer]: KeywordPortfolios.com.br Contact a health care provider if you have: A fever. Mild pelvic cramps, pelvic pressure, or nagging pain in your abdominal area or lower back. Vomiting or diarrhea. Bad-smelling vaginal discharge or foul-smelling urine. Pain when you urinate. A headache that does not go away when you take medicine. Visual changes or see spots in front of your eyes. Get help right away if: Your water breaks. You have regular contractions less than 5 minutes apart. You have spotting or bleeding from your vagina. You have severe abdominal pain. You have difficulty breathing. You have chest pain. You have fainting spells. You have not felt your baby move for the time period told by your health care provider. You have new or increased pain, swelling, or redness in an arm or leg. Summary The third trimester of pregnancy is from week 28 through week 40 (months 7 through 9). You may have more problems sleeping.  This can be caused by the size of your abdomen, an increased need to urinate, and an increase in your body's metabolism. You will have more frequent prenatal exams during the third trimester. Keep all follow-up visits. This is important. This information is not intended to replace advice given to you by your health care provider. Make sure you discuss any questions you have with your health care provider. Document Revised: 08/15/2019 Document Reviewed: 06/21/2019 Elsevier Patient Education  [place].     Common Medications Safe in Pregnancy  Acne:      Constipation:  Benzoyl Peroxide     Colace  Clindamycin      Dulcolax Suppository  Topica  Erythromycin     Fibercon  Salicylic Acid      Metamucil         Miralax AVOID:        Senakot   Accutane    Cough:  Retin-A       Cough Drops  Tetracycline      Phenergan w/ Codeine if Rx  Minocycline      Robitussin (Plain & DM)  Antibiotics:     Crabs/Lice:  Ceclor       RID  Cephalosporins    AVOID:  E-Mycins      Kwell  Keflex  Macrobid/Macrodantin   Diarrhea:  Penicillin      Kao-Pectate  Zithromax      Imodium AD         PUSH FLUIDS AVOID:       Cipro     Fever:  Tetracycline      Tylenol (Regular or Extra  Minocycline       Strength)  Levaquin      Extra Strength-Do not          Exceed 8 tabs/24 hrs Caffeine:        <293m/day (equiv. To 1 cup of coffee or  approx. 3 12 oz sodas)         Gas: Cold/Hayfever:       Gas-X  Benadryl      Mylicon  Claritin       Phazyme  **Claritin-D        Chlor-Trimeton    Headaches:  Dimetapp      ASA-Free Excedrin  Drixoral-Non-Drowsy     Cold Compress  Mucinex (Guaifenasin)     Tylenol (Regular or Extra  Sudafed/Sudafed-12 Hour     Strength)  **Sudafed PE Pseudoephedrine   Tylenol Cold & Sinus     Vicks Vapor Rub  Zyrtec  **AVOID if Problems With Blood Pressure         Heartburn: Avoid lying down for at least 1 hour after meals  Aciphex      Maalox     Rash:  Milk of Magnesia     Benadryl    Mylanta       1% Hydrocortisone Cream  Pepcid  Pepcid Complete   Sleep Aids:  Prevacid      Ambien   Prilosec       Benadryl  Rolaids       Chamomile Tea  Tums (Limit 4/day)     Unisom         Tylenol PM         Warm milk-add vanilla or  Hemorrhoids:       Sugar for taste  Anusol/Anusol H.C.  (RX: Analapram 2.5%)  Sugar Substitutes:  Hydrocortisone OTC     Ok in moderation  Preparation H  Tucks        Vaseline lotion applied to tissue with wiping    Herpes:     Throat:  Acyclovir      Oragel  Famvir  Valtrex     Vaccines:         Flu Shot Leg Cramps:       *Gardasil  Benadryl      Hepatitis  A         Hepatitis B Nasal Spray:       Pneumovax  Saline Nasal Spray     Polio Booster         Tetanus Nausea:       Tuberculosis test or PPD  Vitamin B6 25 mg TID   AVOID:    Dramamine      *Gardasil  Emetrol       Live Poliovirus  Ginger Root 250 mg QID    MMR (measles, mumps &  High Complex Carbs @ Bedtime    rebella)  Sea Bands-Accupressure    Varicella (Chickenpox)  Unisom 1/2 tab TID     *No known complications           If received before Pain:         Known pregnancy;   Darvocet       Resume series after  Lortab        Delivery  Percocet    Yeast:   Tramadol      Femstat  Tylenol 3      Gyne-lotrimin  Ultram       Monistat  Vicodin           MISC:         All Sunscreens           Hair Coloring/highlights          Insect Repellant's          (Including DEET)         Mystic Tans

## 2021-03-03 NOTE — Progress Notes (Signed)
ROB: She is doing well, she has no concerns today.

## 2021-03-09 ENCOUNTER — Other Ambulatory Visit: Payer: Self-pay

## 2021-03-09 DIAGNOSIS — F1290 Cannabis use, unspecified, uncomplicated: Secondary | ICD-10-CM

## 2021-03-09 DIAGNOSIS — O10919 Unspecified pre-existing hypertension complicating pregnancy, unspecified trimester: Secondary | ICD-10-CM

## 2021-03-09 DIAGNOSIS — O99213 Obesity complicating pregnancy, third trimester: Secondary | ICD-10-CM

## 2021-03-12 ENCOUNTER — Other Ambulatory Visit: Payer: Self-pay | Admitting: Maternal & Fetal Medicine

## 2021-03-12 ENCOUNTER — Other Ambulatory Visit: Payer: Self-pay

## 2021-03-12 ENCOUNTER — Ambulatory Visit: Payer: Commercial Managed Care - PPO | Attending: Maternal & Fetal Medicine

## 2021-03-12 DIAGNOSIS — E669 Obesity, unspecified: Secondary | ICD-10-CM | POA: Insufficient documentation

## 2021-03-12 DIAGNOSIS — O10919 Unspecified pre-existing hypertension complicating pregnancy, unspecified trimester: Secondary | ICD-10-CM

## 2021-03-12 DIAGNOSIS — F1290 Cannabis use, unspecified, uncomplicated: Secondary | ICD-10-CM

## 2021-03-12 DIAGNOSIS — Z363 Encounter for antenatal screening for malformations: Secondary | ICD-10-CM | POA: Insufficient documentation

## 2021-03-12 DIAGNOSIS — O99320 Drug use complicating pregnancy, unspecified trimester: Secondary | ICD-10-CM

## 2021-03-12 DIAGNOSIS — O99213 Obesity complicating pregnancy, third trimester: Secondary | ICD-10-CM

## 2021-03-12 DIAGNOSIS — O10913 Unspecified pre-existing hypertension complicating pregnancy, third trimester: Secondary | ICD-10-CM | POA: Insufficient documentation

## 2021-03-12 DIAGNOSIS — Z3A34 34 weeks gestation of pregnancy: Secondary | ICD-10-CM | POA: Diagnosis not present

## 2021-03-12 DIAGNOSIS — O99323 Drug use complicating pregnancy, third trimester: Secondary | ICD-10-CM | POA: Insufficient documentation

## 2021-03-12 DIAGNOSIS — O10013 Pre-existing essential hypertension complicating pregnancy, third trimester: Secondary | ICD-10-CM | POA: Diagnosis not present

## 2021-03-17 ENCOUNTER — Ambulatory Visit (INDEPENDENT_AMBULATORY_CARE_PROVIDER_SITE_OTHER): Payer: Commercial Managed Care - PPO | Admitting: Obstetrics and Gynecology

## 2021-03-17 ENCOUNTER — Encounter: Payer: Self-pay | Admitting: Obstetrics and Gynecology

## 2021-03-17 ENCOUNTER — Other Ambulatory Visit: Payer: Self-pay

## 2021-03-17 VITALS — BP 123/81 | HR 90 | Wt 292.8 lb

## 2021-03-17 DIAGNOSIS — Z3403 Encounter for supervision of normal first pregnancy, third trimester: Secondary | ICD-10-CM

## 2021-03-17 DIAGNOSIS — Z3A35 35 weeks gestation of pregnancy: Secondary | ICD-10-CM

## 2021-03-17 LAB — POCT URINALYSIS DIPSTICK OB
Bilirubin, UA: NEGATIVE
Blood, UA: NEGATIVE
Glucose, UA: NEGATIVE
Ketones, UA: NEGATIVE
Leukocytes, UA: NEGATIVE
Nitrite, UA: NEGATIVE
POC,PROTEIN,UA: NEGATIVE
Spec Grav, UA: 1.025 (ref 1.010–1.025)
Urobilinogen, UA: 0.2 E.U./dL
pH, UA: 6.5 (ref 5.0–8.0)

## 2021-03-17 NOTE — Progress Notes (Signed)
ROB: Has occasional constipation since beginning iron.  Discussed fiber laxatives.  Blood pressure is good today.  NST with next visit.  Cultures next visit.  MFM growth scan = 34th percentile estimated fetal weight.

## 2021-03-17 NOTE — Progress Notes (Signed)
ROB: S   he has been having some constipation. No other concerns today. No urine at intake.

## 2021-03-19 ENCOUNTER — Encounter: Payer: Self-pay | Admitting: Obstetrics and Gynecology

## 2021-03-21 ENCOUNTER — Encounter: Payer: Self-pay | Admitting: Obstetrics and Gynecology

## 2021-03-22 NOTE — L&D Delivery Note (Signed)
Delivery Summary for Kelly Spence  Labor Events:   Preterm labor: No data found  Rupture date: 04/07/2021  Rupture time: 9:24 PM  Rupture type: Artificial  Fluid Color: Clear  Induction: No data found  Augmentation: No data found  Complications: No data found  Cervical ripening: No data found No data found   No data found     Delivery:   Episiotomy: No data found  Lacerations: No data found  Repair suture: No data found  Repair # of packets: No data found  Blood loss (ml): 250   Information for the patient's newborn:  Jimma, Ortman Girl Reyanne [[id number]]   Delivery 04/08/2021 5:49 AM by  Vaginal, Spontaneous Sex:  female Gestational Age: [redacted]w[redacted]d Delivery Clinician:   Living?:         APGARS  One minute Five minutes Ten minutes  Skin color:        Heart rate:        Grimace:        Muscle tone:        Breathing:        Totals: 8  9      Presentation/position:      Resuscitation:   Cord information:    Disposition of cord blood:     Blood gases sent?  Complications:   Placenta: Delivered:       appearance Newborn Measurements: Weight: 6 lb 9.8 oz (3000 g)  Height: 19.5"  Head circumference:    Chest circumference:    Other providers:    Additional  information: Forceps:   Vacuum:   Breech:   Observed anomalies       Delivery Note At 5:49 AM a viable and healthy female was delivered via Vaginal, Spontaneous (Presentation: Vertex; ROA position).  APGAR: 8, 9; weight  3000 grams.   Placenta status: spontaneously removed, Intact.  Cord: 3 vessels with the following complications: None.  Cord pH: not obtained.  Delayed cord clamping observed. Cord blood collected.   Anesthesia: Epidural Episiotomy: None Lacerations: None Suture Repair:  None Est. Blood Loss (mL):  250  Mom to postpartum.  Baby to Couplet care / Skin to Skin.  [name] 04/08/2021, 6:15 AM

## 2021-03-27 ENCOUNTER — Other Ambulatory Visit: Payer: Self-pay

## 2021-03-27 ENCOUNTER — Observation Stay
Admission: EM | Admit: 2021-03-27 | Discharge: 2021-03-27 | Disposition: A | Discharge status: Home / Self Care | Payer: Commercial Managed Care - PPO | Attending: Obstetrics and Gynecology | Admitting: Obstetrics and Gynecology

## 2021-03-27 ENCOUNTER — Encounter: Payer: Self-pay | Admitting: Obstetrics and Gynecology

## 2021-03-27 DIAGNOSIS — O26893 Other specified pregnancy related conditions, third trimester: Secondary | ICD-10-CM

## 2021-03-27 DIAGNOSIS — Z3A36 36 weeks gestation of pregnancy: Secondary | ICD-10-CM | POA: Diagnosis not present

## 2021-03-27 DIAGNOSIS — R198 Other specified symptoms and signs involving the digestive system and abdomen: Secondary | ICD-10-CM | POA: Diagnosis present

## 2021-03-27 DIAGNOSIS — O0993 Supervision of high risk pregnancy, unspecified, third trimester: Principal | ICD-10-CM | POA: Insufficient documentation

## 2021-03-27 LAB — COMPREHENSIVE METABOLIC PANEL
ALT: 12 U/L (ref 0–44)
AST: 13 U/L — ABNORMAL LOW (ref 15–41)
Albumin: 2.8 g/dL — ABNORMAL LOW (ref 3.5–5.0)
Alkaline Phosphatase: 110 U/L (ref 38–126)
Anion gap: 6 (ref 5–15)
BUN: 13 mg/dL (ref 6–20)
CO2: 21 mmol/L — ABNORMAL LOW (ref 22–32)
Calcium: 8.9 mg/dL (ref 8.9–10.3)
Chloride: 108 mmol/L (ref 98–111)
Creatinine, Ser: 0.51 mg/dL (ref 0.44–1.00)
GFR, Estimated: >60 mL/min (ref >60)
Glucose, Bld: 71 mg/dL (ref 70–99)
Potassium: 3.6 mmol/L (ref 3.5–5.1)
Sodium: 135 mmol/L (ref 135–145)
Total Bilirubin: 0.3 mg/dL (ref 0.3–1.2)
Total Protein: 7 g/dL (ref 6.5–8.1)

## 2021-03-27 LAB — CBC
HCT: 31 % — ABNORMAL LOW (ref 36.0–46.0)
Hemoglobin: 10.2 g/dL — ABNORMAL LOW (ref 12.0–15.0)
MCH: 27.4 pg (ref 26.0–34.0)
MCHC: 32.9 g/dL (ref 30.0–36.0)
MCV: 83.3 fL (ref 80.0–100.0)
Platelets: 247 10*3/uL (ref 150–400)
RBC: 3.72 MIL/uL — ABNORMAL LOW (ref 3.87–5.11)
RDW: 15.6 % — ABNORMAL HIGH (ref 11.5–15.5)
WBC: 8.7 10*3/uL (ref 4.0–10.5)
nRBC: 0.2 % (ref 0.0–0.2)

## 2021-03-27 LAB — PROTEIN / CREATININE RATIO, URINE
Creatinine, Urine: 156 mg/dL
Protein Creatinine Ratio: 0.15 mg/mg{Cre} (ref 0.00–0.15)
Total Protein, Urine: 23 mg/dL

## 2021-03-27 MED ORDER — ACETAMINOPHEN 500 MG PO TABS
1000.0000 mg | ORAL_TABLET | Freq: Four times a day (QID) | ORAL | Status: AC | PRN
  Administered 2021-03-27: 1000 mg via ORAL
  Filled 2021-03-27: qty 2

## 2021-03-27 NOTE — Progress Notes (Signed)
This RN received verbal order via previous RN that pt safe to DC when lab results back and strip is looking good and reactive. CNM Thompson in department and verified that pt safe for DC.

## 2021-03-28 NOTE — Discharge Summary (Signed)
° ° °  L&D OB Triage Note  SUBJECTIVE Kelly Spence is a 31 y.o. G1P0000 female at [redacted]w[redacted]d, EDD Estimated Date of Delivery: 04/20/21 who presented to triage after being sent from [facility] for possibility of HTN.  OB History  Gravida Para Term Preterm AB Living  1 0 0 0 0 0  SAB IAB Ectopic Multiple Live Births  0 0 0 0 0    # Outcome Date GA Lbr Len/2nd Weight Sex Delivery Anes PTL Lv  1 Current             No medications prior to admission.     OBJECTIVE  Nursing Evaluation:   BP (!) 141/85    Pulse 72    Temp 98.8 F (37.1 C) (Oral)    Ht 5\' 11"  (1.803 m)    Wt 132.5 kg    LMP 07/14/2020 (Exact Date)    BMI 40.73 kg/m    Findings:        Patient not hypotensive in L&D.     PIH labs negative      NST was performed and has been reviewed by me.  NST INTERPRETATION: Category I  Mode: External Baseline Rate (A): 140 bpm Variability: Moderate Accelerations: 15 x 15 Decelerations: None     Contraction Frequency (min): none noted  ASSESSMENT Impression:  1.  Pregnancy:  G1P0000 at [redacted]w[redacted]d , EDD Estimated Date of Delivery: 04/20/21 2.  Reassuring fetal and maternal status 3.  Patient not hypertensive at presentation-no [facility] currently  PLAN 1. Current condition and above findings reviewed.  Reassuring fetal and maternal condition. 2. Discharge home with standard labor precautions given to return to L&D or call the office for problems. 3. Continue routine prenatal care.

## 2021-04-01 ENCOUNTER — Other Ambulatory Visit: Payer: Self-pay | Admitting: *Deleted

## 2021-04-01 ENCOUNTER — Other Ambulatory Visit: Payer: Self-pay

## 2021-04-01 ENCOUNTER — Ambulatory Visit (INDEPENDENT_AMBULATORY_CARE_PROVIDER_SITE_OTHER): Payer: Commercial Managed Care - PPO | Admitting: Obstetrics and Gynecology

## 2021-04-01 VITALS — BP 144/92 | HR 99 | Wt 302.0 lb

## 2021-04-01 DIAGNOSIS — Z113 Encounter for screening for infections with a predominantly sexual mode of transmission: Secondary | ICD-10-CM

## 2021-04-01 DIAGNOSIS — O0993 Supervision of high risk pregnancy, unspecified, third trimester: Secondary | ICD-10-CM | POA: Diagnosis not present

## 2021-04-01 DIAGNOSIS — O99210 Obesity complicating pregnancy, unspecified trimester: Secondary | ICD-10-CM | POA: Diagnosis not present

## 2021-04-01 DIAGNOSIS — O10919 Unspecified pre-existing hypertension complicating pregnancy, unspecified trimester: Secondary | ICD-10-CM | POA: Diagnosis not present

## 2021-04-01 DIAGNOSIS — Z3A37 37 weeks gestation of pregnancy: Secondary | ICD-10-CM

## 2021-04-01 DIAGNOSIS — Z3685 Encounter for antenatal screening for Streptococcus B: Secondary | ICD-10-CM

## 2021-04-01 LAB — POCT URINALYSIS DIPSTICK OB
Bilirubin, UA: NEGATIVE
Blood, UA: NEGATIVE
Glucose, UA: NEGATIVE
Ketones, UA: NEGATIVE
Leukocytes, UA: NEGATIVE
Nitrite, UA: NEGATIVE
POC,PROTEIN,UA: NEGATIVE
Spec Grav, UA: 1.015 (ref 1.010–1.025)
Urobilinogen, UA: 0.2 E.U./dL
pH, UA: 7.5 (ref 5.0–8.0)

## 2021-04-01 NOTE — Patient Instructions (Addendum)
COVID 19 Instructions for Scheduled Procedure (Inductions/C-sections and GYN surgeries)   Thank you for choosing [facility] for your services.  You have been scheduled for a procedure called _____Induction of Labor____________________.    Your procedure is scheduled on _______Tuesday, _January 17, 2023 at midnight__________.  You are required to have COVID-19 testing performed 2 days prior to your scheduled procedure date.  Testing is performed between 8 AM and 12 PM Monday through Friday.  Please present for testing on __Friday, January 13, 2023______ during this hour. Testing is performed in front of the [place] in the Pre-Admission Testing area on the second floor. Please call [phone] to schedule your COVID testing.     Upon your scheduled procedure date, you will need to arrive at the [place] entrance. (There is a statue at the front of this entrance.)   Please arrive on time if you are scheduled for an induction of labor.   If you are scheduled for a Cesarean delivery or for Gyn Surgery, arrive 2 hours prior to your procedure time.  If you are an Obstetric patient and your arrival time falls between 11 PM and 6 AM call L&D [phone]) when you arrive.  A staff member will meet you at the [place] entrance.  At this time, patients are allowed 2 support persons to accompany them. Face masks are required for you and your support person(s). Your support person(s) are now allowed to be there with you during the entire time of your admission.   Please contact the office if you have any questions regarding this information.  The [facility] office number is [phone].     Thank you,    Your [facility] Providers     Labor Induction Labor induction is when steps are taken to cause a pregnant woman to begin the labor process. Most women go into labor on their own between 37 weeks and 42 weeks of pregnancy. When this does not happen, or when there is  a medical need for labor to begin, steps may be taken to induce, or bring on, labor. Labor induction causes a pregnant woman's uterus to contract. It also causes the cervix to soften (ripen), open (dilate), and thin out. Usually, labor is not induced before 39 weeks of pregnancy unless there is a medical reason to do so. When is labor induction considered? Labor induction may be right for you if: Your pregnancy lasts longer than 41 to 42 weeks. Your placenta is separating from your uterus (placental abruption). You have a rupture of membranes and your labor does not begin. You have health problems, like diabetes or high blood pressure (preeclampsia) during your pregnancy. Your baby has stopped growing or does not have enough amniotic fluid. Before labor induction begins, your health care provider will consider the following factors: Your medical condition and the baby's condition. How many weeks you have been pregnant. How mature the baby's lungs are. The condition of your cervix. The position of the baby. The size of your birth canal. Tell a health care provider about: Any allergies you have. All medicines you are taking, including vitamins, herbs, eye drops, creams, and over-the-counter medicines. Any problems you or your family members have had with anesthetic medicines. Any surgeries you have had. Any blood disorders you have. Any medical conditions you have. What are the risks? Generally, this is a safe procedure. However, problems may occur, including: Failed induction. Changes in fetal heart rate, such as being too  high, too low, or irregular (erratic). Infection in the mother or the baby. Increased risk of having a cesarean delivery. Breaking off (abruption) of the placenta from the uterus. This is rare. Rupture of the uterus. This is very rare. Your baby could fail to get enough blood flow or oxygen. This can be life-threatening. When induction is needed for medical reasons,  the benefits generally outweigh the risks. What happens during the procedure? During the procedure, your health care provider will use one of these methods to induce labor: Stripping the membranes. In this method, the amniotic sac tissue is gently separated from the cervix. This causes the following to happen: Your cervix stretches, which in turn causes the release of prostaglandins. Prostaglandins induce labor and cause the uterus to contract. This procedure is often done in an office visit. You will be sent home to wait for contractions to begin. Prostaglandin medicine. This medicine starts contractions and causes the cervix to dilate and ripen. This can be taken by mouth (orally) or by being inserted into the vagina (suppository). Inserting a small, thin tube (catheter) with a balloon into the vagina and then expanding the balloon with water to dilate the cervix. Breaking the water. In this method, a small instrument is used to make a small hole in the amniotic sac. This eventually causes the amniotic sac to break. Contractions should begin within a few hours. Medicine to trigger or strengthen contractions. This medicine is given through an IV that is inserted into a vein in your arm. This procedure may vary among health care providers and hospitals. Where to find more information March of Dimes: www.marchofdimes.org [employer] of Obstetricians and Gynecologists: www.acog.org Summary Labor induction causes a pregnant woman's uterus to contract. It also causes the cervix to soften (ripen), open (dilate), and thin out. Labor is usually not induced before 39 weeks of pregnancy unless there is a medical reason to do so. When induction is needed for medical reasons, the benefits generally outweigh the risks. Talk with your health care provider about which methods of labor induction are right for you. This information is not intended to replace advice given to you by your health care  provider. Make sure you discuss any questions you have with your health care provider. Document Revised: 12/20/2019 Document Reviewed: 12/20/2019 Elsevier Patient Education  [place].

## 2021-04-01 NOTE — Progress Notes (Signed)
ROB: Patient notes some pelvic pressure. BPs elevated today, reports also being elevated in triage over the weekend. Occasionally reports floaters, and noted a headache yesterday which has resolved.  Patient with a h/o CHTN, currently on no meds.  Will get [place] labs, given precautions. Discussed likelihood of delivery next week as BPs increasing. Will schedule for 1/17 at midnight.  Discussed COVID testing, visitor policy. 36 week cultures performed today. NST performed today was reviewed and was found to be reactive.  Continue recommended antenatal testing and prenatal care.   NONSTRESS TEST INTERPRETATION  INDICATIONS: Chronic hypertension and Obesity  FHR baseline: 135 bpm RESULTS:Reactive COMMENTS: Uterine irritability   PLAN: 1. Continue fetal kick counts twice a day. 2. Scheduled for IOL next week for cHTN.

## 2021-04-02 ENCOUNTER — Telehealth: Payer: Self-pay | Admitting: Obstetrics and Gynecology

## 2021-04-02 LAB — CBC
Hematocrit: 32.5 % — ABNORMAL LOW (ref 34.0–46.6)
Hemoglobin: 10.9 g/dL — ABNORMAL LOW (ref 11.1–15.9)
MCH: 27.4 pg (ref 26.6–33.0)
MCHC: 33.5 g/dL (ref 31.5–35.7)
MCV: 82 fL (ref 79–97)
Platelets: 258 10*3/uL (ref 150–450)
RBC: 3.98 x10E6/uL (ref 3.77–5.28)
RDW: 15.6 % — ABNORMAL HIGH (ref 11.7–15.4)
WBC: 8.5 10*3/uL (ref 3.4–10.8)

## 2021-04-02 LAB — COMPREHENSIVE METABOLIC PANEL
ALT: 17 IU/L (ref 0–32)
AST: 14 IU/L (ref 0–40)
Albumin/Globulin Ratio: 1.1 — ABNORMAL LOW (ref 1.2–2.2)
Albumin: 3.5 g/dL — ABNORMAL LOW (ref 3.9–5.0)
Alkaline Phosphatase: 132 IU/L — ABNORMAL HIGH (ref 44–121)
BUN/Creatinine Ratio: 16 (ref 9–23)
BUN: 11 mg/dL (ref 6–20)
Bilirubin Total: <0.2 mg/dL (ref 0.0–1.2)
CO2: 18 mmol/L — ABNORMAL LOW (ref 20–29)
Calcium: 9.3 mg/dL (ref 8.7–10.2)
Chloride: 104 mmol/L (ref 96–106)
Creatinine, Ser: 0.7 mg/dL (ref 0.57–1.00)
Globulin, Total: 3.3 g/dL (ref 1.5–4.5)
Glucose: 62 mg/dL — ABNORMAL LOW (ref 70–99)
Potassium: 4.4 mmol/L (ref 3.5–5.2)
Sodium: 138 mmol/L (ref 134–144)
Total Protein: 6.8 g/dL (ref 6.0–8.5)
eGFR: 119 mL/min/{1.73_m2} (ref >59)

## 2021-04-02 LAB — PROTEIN / CREATININE RATIO, URINE
Creatinine, Urine: 125 mg/dL
Protein, Ur: 12.3 mg/dL
Protein/Creat Ratio: 98 mg/g creat (ref 0–200)

## 2021-04-02 NOTE — Telephone Encounter (Signed)
Pt called asking about clarification on inudction date and time also covid testing apt- she said she saw on her AVS a different date and time than she remembers discussing. Please Advise.

## 2021-04-03 ENCOUNTER — Other Ambulatory Visit: Payer: Self-pay

## 2021-04-03 ENCOUNTER — Other Ambulatory Visit
Admission: RE | Admit: 2021-04-03 | Discharge: 2021-04-03 | Disposition: A | Discharge status: Home / Self Care | Payer: Commercial Managed Care - PPO | Source: Ambulatory Visit | Attending: Obstetrics and Gynecology | Admitting: Obstetrics and Gynecology

## 2021-04-03 DIAGNOSIS — Z20822 Contact with and (suspected) exposure to covid-19: Secondary | ICD-10-CM | POA: Diagnosis not present

## 2021-04-03 DIAGNOSIS — Z01812 Encounter for preprocedural laboratory examination: Secondary | ICD-10-CM | POA: Insufficient documentation

## 2021-04-03 LAB — GC/CHLAMYDIA PROBE AMP
Chlamydia trachomatis, NAA: NEGATIVE
Neisseria Gonorrhoeae by PCR: NEGATIVE

## 2021-04-03 NOTE — Telephone Encounter (Signed)
According to notes (Will schedule for 1/17 at midnight) Patient informed.

## 2021-04-04 LAB — SARS CORONAVIRUS 2 (TAT 6-24 HRS): SARS Coronavirus 2: NEGATIVE

## 2021-04-05 LAB — CULTURE, BETA STREP (GROUP B ONLY): Strep Gp B Culture: NEGATIVE

## 2021-04-07 ENCOUNTER — Encounter: Payer: Self-pay | Admitting: Obstetrics and Gynecology

## 2021-04-07 ENCOUNTER — Inpatient Hospital Stay
Admission: RE | Admit: 2021-04-07 | Discharge: 2021-04-09 | DRG: 807 | Disposition: A | Discharge status: Home / Self Care | Payer: Commercial Managed Care - PPO | Attending: Obstetrics and Gynecology | Admitting: Obstetrics and Gynecology

## 2021-04-07 ENCOUNTER — Other Ambulatory Visit: Payer: Self-pay

## 2021-04-07 DIAGNOSIS — Z3A38 38 weeks gestation of pregnancy: Secondary | ICD-10-CM | POA: Diagnosis not present

## 2021-04-07 DIAGNOSIS — O99210 Obesity complicating pregnancy, unspecified trimester: Secondary | ICD-10-CM

## 2021-04-07 DIAGNOSIS — O10013 Pre-existing essential hypertension complicating pregnancy, third trimester: Secondary | ICD-10-CM

## 2021-04-07 DIAGNOSIS — O99323 Drug use complicating pregnancy, third trimester: Secondary | ICD-10-CM | POA: Diagnosis not present

## 2021-04-07 DIAGNOSIS — O10919 Unspecified pre-existing hypertension complicating pregnancy, unspecified trimester: Secondary | ICD-10-CM | POA: Diagnosis present

## 2021-04-07 DIAGNOSIS — F1210 Cannabis abuse, uncomplicated: Secondary | ICD-10-CM

## 2021-04-07 DIAGNOSIS — O1002 Pre-existing essential hypertension complicating childbirth: Secondary | ICD-10-CM | POA: Diagnosis present

## 2021-04-07 DIAGNOSIS — Z87891 Personal history of nicotine dependence: Secondary | ICD-10-CM

## 2021-04-07 DIAGNOSIS — Z7982 Long term (current) use of aspirin: Secondary | ICD-10-CM | POA: Diagnosis not present

## 2021-04-07 DIAGNOSIS — O99214 Obesity complicating childbirth: Secondary | ICD-10-CM | POA: Diagnosis present

## 2021-04-07 DIAGNOSIS — O99324 Drug use complicating childbirth: Secondary | ICD-10-CM | POA: Diagnosis not present

## 2021-04-07 LAB — CBC
HCT: 32.7 % — ABNORMAL LOW (ref 36.0–46.0)
Hemoglobin: 10.8 g/dL — ABNORMAL LOW (ref 12.0–15.0)
MCH: 27.2 pg (ref 26.0–34.0)
MCHC: 33 g/dL (ref 30.0–36.0)
MCV: 82.4 fL (ref 80.0–100.0)
Platelets: 250 10*3/uL (ref 150–400)
RBC: 3.97 MIL/uL (ref 3.87–5.11)
RDW: 16.2 % — ABNORMAL HIGH (ref 11.5–15.5)
WBC: 9.7 10*3/uL (ref 4.0–10.5)
nRBC: 0 % (ref 0.0–0.2)

## 2021-04-07 LAB — URINE DRUG SCREEN, QUALITATIVE (ARMC ONLY)
Amphetamines, Ur Screen: NOT DETECTED
Barbiturates, Ur Screen: NOT DETECTED
Benzodiazepine, Ur Scrn: NOT DETECTED
Cannabinoid 50 Ng, Ur ~~LOC~~: NOT DETECTED
Cocaine Metabolite,Ur ~~LOC~~: NOT DETECTED
MDMA (Ecstasy)Ur Screen: NOT DETECTED
Methadone Scn, Ur: NOT DETECTED
Opiate, Ur Screen: NOT DETECTED
Phencyclidine (PCP) Ur S: NOT DETECTED
Tricyclic, Ur Screen: NOT DETECTED

## 2021-04-07 LAB — TYPE AND SCREEN
ABO/RH(D): O POS
Antibody Screen: NEGATIVE

## 2021-04-07 LAB — ABO/RH: ABO/RH(D): O POS

## 2021-04-07 MED ORDER — OXYTOCIN-SODIUM CHLORIDE 30-0.9 UT/500ML-% IV SOLN
1.0000 m[IU]/min | INTRAVENOUS | Status: DC
  Administered 2021-04-07: 2 m[IU]/min via INTRAVENOUS

## 2021-04-07 MED ORDER — OXYTOCIN 10 UNIT/ML IJ SOLN
INTRAMUSCULAR | Status: AC
  Filled 2021-04-07: qty 2

## 2021-04-07 MED ORDER — BUTORPHANOL TARTRATE 1 MG/ML IJ SOLN: 1.0000 mg | INTRAMUSCULAR | Status: DC | PRN

## 2021-04-07 MED ORDER — LIDOCAINE HCL (PF) 1 % IJ SOLN: 30.0000 mL | INTRAMUSCULAR | Status: DC | PRN

## 2021-04-07 MED ORDER — OXYCODONE-ACETAMINOPHEN 5-325 MG PO TABS: 1.0000 | ORAL_TABLET | ORAL | Status: DC | PRN

## 2021-04-07 MED ORDER — TERBUTALINE SULFATE 1 MG/ML IJ SOLN: 0.2500 mg | Freq: Once | INTRAMUSCULAR | Status: DC | PRN

## 2021-04-07 MED ORDER — LACTATED RINGERS IV SOLN
500.0000 mL | INTRAVENOUS | Status: DC | PRN
  Administered 2021-04-08: 500 mL via INTRAVENOUS

## 2021-04-07 MED ORDER — OXYTOCIN BOLUS FROM INFUSION
333.0000 mL | Freq: Once | INTRAVENOUS | Status: DC
  Administered 2021-04-08: 333 mL via INTRAVENOUS

## 2021-04-07 MED ORDER — OXYTOCIN-SODIUM CHLORIDE 30-0.9 UT/500ML-% IV SOLN
2.5000 [IU]/h | INTRAVENOUS | Status: DC
  Administered 2021-04-08: 2.5 [IU]/h via INTRAVENOUS
  Filled 2021-04-07: qty 1000

## 2021-04-07 MED ORDER — LIDOCAINE HCL (PF) 1 % IJ SOLN
INTRAMUSCULAR | Status: AC
  Filled 2021-04-07: qty 30

## 2021-04-07 MED ORDER — OXYCODONE-ACETAMINOPHEN 5-325 MG PO TABS
2.0000 | ORAL_TABLET | ORAL | Status: DC | PRN
  Administered 2021-04-07: 2 via ORAL
  Filled 2021-04-07: qty 2

## 2021-04-07 MED ORDER — ONDANSETRON HCL 4 MG/2ML IJ SOLN
4.0000 mg | Freq: Four times a day (QID) | INTRAMUSCULAR | Status: DC | PRN
  Administered 2021-04-08: 4 mg via INTRAVENOUS
  Filled 2021-04-07: qty 2

## 2021-04-07 MED ORDER — MISOPROSTOL 50MCG HALF TABLET
50.0000 ug | ORAL_TABLET | ORAL | Status: DC | PRN
  Administered 2021-04-07 (×2): 50 ug via VAGINAL
  Filled 2021-04-07 (×3): qty 1

## 2021-04-07 MED ORDER — LACTATED RINGERS IV SOLN: INTRAVENOUS | Status: DC

## 2021-04-07 MED ORDER — NIFEDIPINE ER OSMOTIC RELEASE 30 MG PO TB24
30.0000 mg | ORAL_TABLET | Freq: Two times a day (BID) | ORAL | Status: DC
  Administered 2021-04-07 – 2021-04-09 (×4): 30 mg via ORAL
  Filled 2021-04-07 (×4): qty 1

## 2021-04-07 MED ORDER — SOD CITRATE-CITRIC ACID 500-334 MG/5ML PO SOLN: 30.0000 mL | ORAL | Status: DC | PRN

## 2021-04-07 MED ORDER — AMMONIA AROMATIC IN INHA
RESPIRATORY_TRACT | Status: AC
  Filled 2021-04-07: qty 10

## 2021-04-07 MED ORDER — ACETAMINOPHEN 325 MG PO TABS: 650.0000 mg | ORAL_TABLET | ORAL | Status: DC | PRN

## 2021-04-07 MED ORDER — MISOPROSTOL 200 MCG PO TABS
ORAL_TABLET | ORAL | Status: AC
  Filled 2021-04-07: qty 4

## 2021-04-07 NOTE — Progress Notes (Signed)
Intrapartum Progress Note  S: Patient noting some pain with contractions.   O: Blood pressure (!) 149/93, pulse 89, temperature 98.4 F (36.9 C), temperature source Oral, resp. rate 18, height 5\' 10"  (1.778 m), weight (!) 137 kg, last menstrual period 07/14/2020. Gen App: NAD, appears comfortable (inconsistent with reported pain scale of 9/10) Abdomen: soft, gravid FHT: baseline 140 bpm.  Accels present.  Decels absent. moderate in degree variability.   Tocometer: q 2-3 minutes Cervix: Foley bulb expelled with gentle traction. 6-7/70/-3 Extremities: Nontender, no edema.  Pitocin: None  Labs:  No new labs   Assessment:  1: SIUP at [redacted]w[redacted]d 2. cHTN in pregnancy, previously on no meds. 3. Category I fetal tracing  Plan:  1. Continue IOL.  AROM'd with clear fluid.  Can begin Pitocin if needed.  2. Procardia for treatment of BPs.  3. Anticipate vaginal delivery   [name], MD 04/07/2021 9:31 PM

## 2021-04-07 NOTE — H&P (Signed)
Obstetric History and Physical  Kelly Spence is a 31 y.o. G1P0000 with IUP at [redacted]w[redacted]d presenting for scheduled IOL for cHTN in pregnancy. Patient states she has been having  occasional contractions,  no  vaginal bleeding, intact membranes, with active fetal movement.    Prenatal Course Source of Care: [facility] with onset of care at  weeks Pregnancy complications or risks: Patient Active Problem List   Diagnosis Date Noted   Chronic hypertension affecting pregnancy 04/07/2021   Indication for care in labor or delivery 03/27/2021   Abdominal complaints 03/27/2021   Tobacco use 12/01/2018   Obesity, unspecified 03/31/2018   Elevated blood pressure reading without diagnosis of hypertension 03/01/2017   She plans to breastfeed She desires  unsure method  for postpartum contraception.   Prenatal labs and studies: ABO, Rh: O/Positive/-- (06/27 1113) Antibody: Positive, See Final Results (06/27 1113) Rubella: 5.92 (06/27 1113) RPR: Non Reactive (11/15 1227)  HBsAg: Negative (06/27 1113)  HIV: Non Reactive (06/27 1113)  BOF:BPZWCHEN/-- (01/11 1456) 1 hr Glucola  normal Genetic screening normal Anatomy US normal   Past Medical History:  Diagnosis Date   Borderline diabetes    diagnosed in high school   Hypertension     Past Surgical History:  Procedure Laterality Date   FOOT SURGERY     5th toe, left foot    OB History  Gravida Para Term Preterm AB Living  1 0 0 0 0 0  SAB IAB Ectopic Multiple Live Births  0 0 0 0 0    # Outcome Date GA Lbr Len/2nd Weight Sex Delivery Anes PTL Lv  1 Current             Social History   Socioeconomic History   Marital status: Single    Spouse name: Not on file   Number of children: Not on file   Years of education: Not on file   Highest education level: Not on file  Occupational History   Occupation: Tele[employer]  Tobacco Use   Smoking status: Former    Packs/day: 0.10    Types: Cigarettes   Smokeless  tobacco: Never  Vaping Use   Vaping Use: Never used  Substance and Sexual Activity   Alcohol use: No   Drug use: Not Currently    Types: Marijuana   Sexual activity: Yes    Partners: Male    Birth control/protection: Injection  Other Topics Concern   Not on file  Social History Narrative   Not on file   Social Determinants of Health   Financial Resource Strain: Not on file  Food Insecurity: Not on file  Transportation Needs: Not on file  Physical Activity: Not on file  Stress: Not on file  Social Connections: Not on file    Family History  Problem Relation Age of Onset   Hypertension Mother    Hypertension Father    Lung disease Father    COPD Father    Cancer Paternal Uncle    Diabetes Maternal Grandmother    Stroke Maternal Grandmother    Aneurysm Maternal Grandfather    Cancer Paternal Grandmother    Parkinson's disease Paternal Grandmother    Pancreatic cancer Paternal Grandfather    Seizures Cousin     Medications Prior to Admission  Medication Sig Dispense Refill Last Dose   aspirin EC 81 MG tablet Take 81 mg by mouth daily. Swallow whole.   04/07/2021   ferrous sulfate (FERROUSUL) 325 (65 FE) MG tablet Take 1  tablet (325 mg total) by mouth daily with breakfast. 60 tablet 1 04/07/2021   Prenatal Vit-Fe Fumarate-FA (MULTIVITAMIN-PRENATAL) 27-0.8 MG TABS tablet Take 1 tablet by mouth daily at 12 noon.   04/07/2021   clobetasol ointment (TEMOVATE) 0.05 % Apply topically 2 (two) times daily. (Patient not taking: Reported on 04/07/2021)   Not Taking   doxylamine, Sleep, (UNISOM) 25 MG tablet Take 1 tablet (25 mg total) by mouth at bedtime as needed for up to 5 days for sleep. 5 tablet 0    ondansetron (ZOFRAN) 4 MG tablet Take 1 tablet (4 mg total) by mouth every 8 (eight) hours as needed for nausea or vomiting. (Patient not taking: Reported on 04/07/2021) 20 tablet 0 Not Taking    Allergies  Allergen Reactions   Chocolate Hives, Itching and Rash   Nickel Hives,  Itching and Rash    Review of Systems: Negative except for what is mentioned in HPI.  Physical Exam: BP 133/83 (BP Location: Left Arm)    Pulse 89    Temp 98.7 F (37.1 C) (Oral)    Resp 18    Ht 5\' 10"  (1.778 m)    Wt (!) 137 kg    LMP 07/14/2020 (Exact Date)    BMI 43.34 kg/m  CONSTITUTIONAL: Well-developed, well-nourished female in no acute distress.  HENT:  Normocephalic, atraumatic, External right and left ear normal. Oropharynx is clear and moist EYES: Conjunctivae and EOM are normal. Pupils are equal, round, and reactive to light. No scleral icterus.  NECK: Normal range of motion, supple, no masses SKIN: Skin is warm and dry. No rash noted. Not diaphoretic. No erythema. No pallor. NEUROLOGIC: Alert and oriented to person, place, and time. Normal reflexes, muscle tone coordination. No cranial nerve deficit noted. PSYCHIATRIC: Normal mood and affect. Normal behavior. Normal judgment and thought content. CARDIOVASCULAR: Normal heart rate noted, regular rhythm RESPIRATORY: Effort and breath sounds normal, no problems with respiration noted ABDOMEN: Soft, nontender, nondistended, gravid. MUSCULOSKELETAL: Normal range of motion. No edema and no tenderness. 2+ distal pulses.  Cervical Exam: Dilatation 0.5 cm with funneling (external os 2 cm)  Effacement 30-40%   Station -3   Presentation: cephalic FHT:  Baseline rate 135 bpm   Variability moderate  Accelerations present   Decelerations none Contractions: Uterine irritability   Pertinent Labs/Studies:   Results for orders placed or performed during the hospital encounter of 04/07/21 (from the past 24 hour(s))  Urine Drug Screen, Qualitative ([facility] only)     Status: None   Collection Time: 04/07/21 11:43 AM  Result Value Ref Range   Tricyclic, Ur Screen NONE DETECTED NONE DETECTED   Amphetamines, Ur Screen NONE DETECTED NONE DETECTED   MDMA (Ecstasy)Ur Screen NONE DETECTED NONE DETECTED   Cocaine Metabolite,Ur [place] NONE DETECTED NONE  DETECTED   Opiate, Ur Screen NONE DETECTED NONE DETECTED   Phencyclidine (PCP) Ur S NONE DETECTED NONE DETECTED   Cannabinoid 50 Ng, Ur [place] NONE DETECTED NONE DETECTED   Barbiturates, Ur Screen NONE DETECTED NONE DETECTED   Benzodiazepine, Ur Scrn NONE DETECTED NONE DETECTED   Methadone Scn, Ur NONE DETECTED NONE DETECTED     Assessment : Carrell L Mckercher is a 31 y.o. G1P0000 at 100w1d being admitted for labor induction due to chronic HTN in pregnancy (no meds). H/o marijuana use this pregnancy.   Plan: Labor: Admission labs ordered. Induction as ordered  with Cytotec as per protocol. Cook's foley catheter placed. Analgesia as needed. Procardia if moderate to severe range BPs occur.  FWB: Reassuring fetal heart tracing.  GBS negative.  Delivery plan: Hopeful for vaginal delivery   [name], MD [facility]

## 2021-04-07 NOTE — Progress Notes (Signed)
Intrapartum Progress Note  S: Patient with no major complaints, does report some crampy pain, possible contractions.   O: Blood pressure (!) 145/91, pulse 95, temperature 98.7 F (37.1 C), temperature source Oral, resp. rate 18, height 5\' 10"  (1.778 m), weight (!) 137 kg, last menstrual period 07/14/2020. Gen App: NAD, comfortable Abdomen: soft, gravid FHT: baseline 135 bpm.  Accels present.  Decels absent. moderate in degree variability.   Tocometer: Difficult to assess with tocometer despite readjustment Cervix: external ~ 3 cm based on nurse exam. Cook's catheter in place.  Extremities: Nontender, no edema.  Pitocin: None  Labs:  Results for orders placed or performed during the hospital encounter of 04/07/21  CBC  Result Value Ref Range   WBC 9.7 4.0 - 10.5 K/uL   RBC 3.97 3.87 - 5.11 MIL/uL   Hemoglobin 10.8 (L) 12.0 - 15.0 g/dL   HCT 32.7 (L) 36.0 - 46.0 %   MCV 82.4 80.0 - 100.0 fL   MCH 27.2 26.0 - 34.0 pg   MCHC 33.0 30.0 - 36.0 g/dL   RDW 16.2 (H) 11.5 - 15.5 %   Platelets 250 150 - 400 K/uL   nRBC 0.0 0.0 - 0.2 %  Type and screen  Result Value Ref Range   ABO/RH(D) O POS    Antibody Screen NEG    Sample Expiration      04/10/2021,2359 Performed at [facility], [address] [place], [place] [address]   ABO/Rh  Result Value Ref Range   ABO/RH(D)      O POS Performed at [facility], [address], [place], [place] [address]      Assessment:  1: SIUP at [redacted]w[redacted]d 2. cHTN in pregnancy, previously on no meds.  Plan:  1. Continue IOL with Cytotec and Cook's catheter. Received second dose at 5 pm.  2. Procardia for treatment of BPs.    [name], MD 04/07/2021 6:28 PM

## 2021-04-08 ENCOUNTER — Inpatient Hospital Stay: Payer: Commercial Managed Care - PPO | Admitting: Anesthesiology

## 2021-04-08 ENCOUNTER — Encounter: Payer: Self-pay | Admitting: Obstetrics and Gynecology

## 2021-04-08 DIAGNOSIS — O1002 Pre-existing essential hypertension complicating childbirth: Principal | ICD-10-CM

## 2021-04-08 DIAGNOSIS — O99324 Drug use complicating childbirth: Secondary | ICD-10-CM

## 2021-04-08 LAB — RPR: RPR Ser Ql: NONREACTIVE

## 2021-04-08 MED ORDER — EPHEDRINE 5 MG/ML INJ: 10.0000 mg | INTRAVENOUS | Status: DC | PRN

## 2021-04-08 MED ORDER — SIMETHICONE 80 MG PO CHEW: 80.0000 mg | CHEWABLE_TABLET | ORAL | Status: DC | PRN

## 2021-04-08 MED ORDER — DIPHENHYDRAMINE HCL 50 MG/ML IJ SOLN: 12.5000 mg | INTRAMUSCULAR | Status: DC | PRN

## 2021-04-08 MED ORDER — BUPIVACAINE HCL (PF) 0.25 % IJ SOLN
INTRAMUSCULAR | Status: DC | PRN
  Administered 2021-04-08: 5 mL via EPIDURAL
  Administered 2021-04-08: 3 mL via EPIDURAL

## 2021-04-08 MED ORDER — FENTANYL-BUPIVACAINE-NACL 0.5-0.125-0.9 MG/250ML-% EP SOLN
12.0000 mL/h | EPIDURAL | Status: DC | PRN
  Administered 2021-04-08: 12 mL/h via EPIDURAL

## 2021-04-08 MED ORDER — PHENYLEPHRINE 40 MCG/ML (10ML) SYRINGE FOR IV PUSH (FOR BLOOD PRESSURE SUPPORT): 80.0000 ug | PREFILLED_SYRINGE | INTRAVENOUS | Status: DC | PRN

## 2021-04-08 MED ORDER — FENTANYL-BUPIVACAINE-NACL 0.5-0.125-0.9 MG/250ML-% EP SOLN
EPIDURAL | Status: AC
  Filled 2021-04-08: qty 250

## 2021-04-08 MED ORDER — DIBUCAINE (PERIANAL) 1 % EX OINT: 1.0000 "application " | TOPICAL_OINTMENT | CUTANEOUS | Status: DC | PRN

## 2021-04-08 MED ORDER — BENZOCAINE-MENTHOL 20-0.5 % EX AERO: 1.0000 "application " | INHALATION_SPRAY | CUTANEOUS | Status: DC | PRN

## 2021-04-08 MED ORDER — SENNOSIDES-DOCUSATE SODIUM 8.6-50 MG PO TABS
2.0000 | ORAL_TABLET | Freq: Every day | ORAL | Status: DC
  Administered 2021-04-09: 2 via ORAL
  Filled 2021-04-08: qty 2

## 2021-04-08 MED ORDER — DIPHENHYDRAMINE HCL 25 MG PO CAPS: 25.0000 mg | ORAL_CAPSULE | Freq: Four times a day (QID) | ORAL | Status: DC | PRN

## 2021-04-08 MED ORDER — ONDANSETRON HCL 4 MG PO TABS: 4.0000 mg | ORAL_TABLET | ORAL | Status: DC | PRN

## 2021-04-08 MED ORDER — IBUPROFEN 600 MG PO TABS
600.0000 mg | ORAL_TABLET | Freq: Four times a day (QID) | ORAL | Status: DC
  Administered 2021-04-08 – 2021-04-09 (×6): 600 mg via ORAL
  Filled 2021-04-08 (×6): qty 1

## 2021-04-08 MED ORDER — ACETAMINOPHEN 325 MG PO TABS
650.0000 mg | ORAL_TABLET | ORAL | Status: DC | PRN
  Administered 2021-04-08: 650 mg via ORAL
  Filled 2021-04-08: qty 2

## 2021-04-08 MED ORDER — LIDOCAINE HCL (PF) 1 % IJ SOLN
INTRAMUSCULAR | Status: DC | PRN
  Administered 2021-04-08: 3 mL via SUBCUTANEOUS

## 2021-04-08 MED ORDER — COCONUT OIL OIL
1.0000 "application " | TOPICAL_OIL | Status: DC | PRN
  Filled 2021-04-08: qty 120

## 2021-04-08 MED ORDER — ONDANSETRON HCL 4 MG/2ML IJ SOLN: 4.0000 mg | INTRAMUSCULAR | Status: DC | PRN

## 2021-04-08 MED ORDER — LIDOCAINE-EPINEPHRINE (PF) 1.5 %-1:200000 IJ SOLN
INTRAMUSCULAR | Status: DC | PRN
  Administered 2021-04-08: 4 mL via EPIDURAL

## 2021-04-08 MED ORDER — LACTATED RINGERS IV SOLN: 500.0000 mL | Freq: Once | INTRAVENOUS | Status: DC

## 2021-04-08 MED ORDER — WITCH HAZEL-GLYCERIN EX PADS: 1.0000 "application " | MEDICATED_PAD | CUTANEOUS | Status: DC | PRN

## 2021-04-08 MED ORDER — PRENATAL MULTIVITAMIN CH
1.0000 | ORAL_TABLET | Freq: Every day | ORAL | Status: DC
  Administered 2021-04-08 – 2021-04-09 (×2): 1 via ORAL
  Filled 2021-04-08 (×2): qty 1

## 2021-04-08 MED ORDER — ZOLPIDEM TARTRATE 5 MG PO TABS: 5.0000 mg | ORAL_TABLET | Freq: Every evening | ORAL | Status: DC | PRN

## 2021-04-08 NOTE — Progress Notes (Signed)
Patient currently up receiving epidural. VSS. Fetal status reassuring. Pitocin currently at 2 mIU.   [name], MD

## 2021-04-08 NOTE — Anesthesia Preprocedure Evaluation (Signed)
Anesthesia Evaluation  Patient identified by MRN, date of birth, ID band Patient awake    Reviewed: Allergy & Precautions, H&P , NPO status , Patient's Chart, lab work & pertinent test results, reviewed documented beta blocker date and time   Airway Mallampati: II  TM Distance: >3 FB Neck ROM: full    Dental no notable dental hx. (+) Teeth Intact   Pulmonary former smoker,    Pulmonary exam normal breath sounds clear to auscultation       Cardiovascular Exercise Tolerance: Good hypertension, On Medications negative cardio ROS   Rhythm:regular Rate:Normal     Neuro/Psych negative neurological ROS  negative psych ROS   GI/Hepatic negative GI ROS, Neg liver ROS,   Endo/Other  diabetes, GestationalMorbid obesity  Renal/GU      Musculoskeletal   Abdominal   Peds  Hematology negative hematology ROS (+)   Anesthesia Other Findings   Reproductive/Obstetrics (+) Pregnancy                             Anesthesia Physical Anesthesia Plan  ASA: 3  Anesthesia Plan: Epidural   Post-op Pain Management:    Induction:   PONV Risk Score and Plan:   Airway Management Planned:   Additional Equipment:   Intra-op Plan:   Post-operative Plan:   Informed Consent: I have reviewed the patients History and Physical, chart, labs and discussed the procedure including the risks, benefits and alternatives for the proposed anesthesia with the patient or authorized representative who has indicated his/her understanding and acceptance.       Plan Discussed with:   Anesthesia Plan Comments:         Anesthesia Quick Evaluation

## 2021-04-08 NOTE — Anesthesia Procedure Notes (Signed)
Epidural Patient location during procedure: OB  Staffing Anesthesiologist: Molli Barrows, MD Performed: anesthesiologist   Preanesthetic Checklist Completed: patient identified, IV checked, site marked, risks and benefits discussed, surgical consent, monitors and equipment checked, pre-op evaluation and timeout performed  Epidural Patient position: sitting Prep: ChloraPrep Patient monitoring: heart rate, continuous pulse ox and blood pressure Approach: midline Location: L4-L5 Injection technique: LOR saline  Needle:  Needle type: Tuohy  Needle gauge: 17 G Needle length: 9 cm and 9 Needle insertion depth: 9 cm Catheter type: closed end flexible Catheter size: 19 Gauge Catheter at skin depth: 14 cm Test dose: negative and 1.5% lidocaine with Epi 1:200 K  Assessment Sensory level: T10 Events: blood not aspirated, injection not painful, no injection resistance, no paresthesia and negative IV test  Additional Notes 1 st attempt Pt. Evaluated and documentation done after procedure finished. Patient identified. Risks/Benefits/Options discussed with patient including but not limited to bleeding, infection, nerve damage, paralysis, failed block, incomplete pain control, headache, blood pressure changes, nausea, vomiting, reactions to medication both or allergic, itching and postpartum back pain. Confirmed with bedside nurse the patient's most recent platelet count. Confirmed with patient that they are not currently taking any anticoagulation, have any bleeding history or any family history of bleeding disorders. Patient expressed understanding and wished to proceed. All questions were answered. Sterile technique was used throughout the entire procedure. Please see nursing notes for vital signs. Test dose was given through epidural catheter and negative prior to continuing to dose epidural or start infusion. Warning signs of high block given to the patient including shortness of breath,  tingling/numbness in hands, complete motor block, or any concerning symptoms with instructions to call for help. Patient was given instructions on fall risk and not to get out of bed. All questions and concerns addressed with instructions to call with any issues or inadequate analgesia.    Patient tolerated the insertion well without immediate complications.Reason for block:procedure for pain

## 2021-04-09 ENCOUNTER — Other Ambulatory Visit: Payer: Self-pay

## 2021-04-09 LAB — CBC
HCT: 29.2 % — ABNORMAL LOW (ref 36.0–46.0)
Hemoglobin: 9.5 g/dL — ABNORMAL LOW (ref 12.0–15.0)
MCH: 26.9 pg (ref 26.0–34.0)
MCHC: 32.5 g/dL (ref 30.0–36.0)
MCV: 82.7 fL (ref 80.0–100.0)
Platelets: 247 10*3/uL (ref 150–400)
RBC: 3.53 MIL/uL — ABNORMAL LOW (ref 3.87–5.11)
RDW: 16.6 % — ABNORMAL HIGH (ref 11.5–15.5)
WBC: 11.3 10*3/uL — ABNORMAL HIGH (ref 4.0–10.5)
nRBC: 0 % (ref 0.0–0.2)

## 2021-04-09 MED ORDER — IBUPROFEN 600 MG PO TABS
600.0000 mg | ORAL_TABLET | Freq: Four times a day (QID) | ORAL | 1 refills | Status: AC | PRN
Start: 2021-04-09 — End: ?
  Filled 2021-04-09: qty 30, 8d supply, fill #0

## 2021-04-09 MED ORDER — NIFEDIPINE ER 30 MG PO TB24
30.0000 mg | ORAL_TABLET | Freq: Two times a day (BID) | ORAL | 0 refills | Status: DC
  Filled 2021-04-09: qty 60, 30d supply, fill #0

## 2021-04-09 MED ORDER — MEDROXYPROGESTERONE ACETATE 150 MG/ML IM SUSP
150.0000 mg | Freq: Once | INTRAMUSCULAR | Status: AC
  Administered 2021-04-09: 150 mg via INTRAMUSCULAR
  Filled 2021-04-09: qty 1

## 2021-04-09 NOTE — Progress Notes (Signed)
Post Partum Day # 1, s/p SVD  Subjective: no complaints, up ad lib, voiding, and tolerating PO  Objective: Temp:  [98.1 F (36.7 C)-99.1 F (37.3 C)] 99.1 F (37.3 C) (01/19 1147) Pulse Rate:  [76-96] 80 (01/19 1147) Resp:  [16-20] 18 (01/19 1147) BP: (109-133)/(64-87) 109/64 (01/19 1147) SpO2:  [97 %-100 %] 98 % (01/19 1147)  Physical Exam:  General: alert and no distress  Lungs: clear to auscultation bilaterally Breasts: normal appearance, no masses or tenderness Heart: regular rate and rhythm, S1, S2 normal, no murmur, click, rub or gallop Abdomen: soft, non-tender; bowel sounds normal; no masses,  no organomegaly Pelvis: Lochia: appropriate, Uterine Fundus: firm Extremities: DVT Evaluation: No evidence of DVT seen on physical exam. Negative Homan's sign. No cords or calf tenderness. No significant calf/ankle edema.   Recent Labs    04/07/21 1445 04/09/21 0636  HGB 10.8* 9.5*  HCT 32.7* 29.2*    Assessment/Plan: Doing well postpartum Breastfeeding, Lactation consult Contraception Depo Provera, dose given today.  Continue Procardia for cHTN in pregnancy. BPs stable Dispo: likely d/c home today.     LOS: 2 days   [name], MD [facility] 04/09/2021 12:02 PM

## 2021-04-09 NOTE — Progress Notes (Signed)
Patient discharged home with family.  Discharge instructions, when to follow up, and prescriptions reviewed with patient.  Patient verbalized understanding. Patient will be escorted out by auxiliary.   

## 2021-04-09 NOTE — Anesthesia Postprocedure Evaluation (Signed)
Anesthesia Post Note  Patient: Kelly Spence  Procedure(s) Performed: AN AD HOC LABOR EPIDURAL  Patient location during evaluation: Mother Baby Anesthesia Type: Epidural Level of consciousness: awake and alert Pain management: pain level controlled Vital Signs Assessment: post-procedure vital signs reviewed and stable Respiratory status: spontaneous breathing, nonlabored ventilation and respiratory function stable Cardiovascular status: stable Postop Assessment: no headache, no backache and epidural receding Anesthetic complications: no   No notable events documented.   Last Vitals:  Vitals:   04/08/21 2320 04/09/21 0344  BP: 133/87 121/85  Pulse: 86 84  Resp: 20 18  Temp: 36.8 C 36.7 C  SpO2: 97% 98%    Last Pain:  Vitals:   04/09/21 0603  TempSrc:   PainSc: 0-No pain                 Caryl Asp

## 2021-04-09 NOTE — Discharge Summary (Signed)
° °  Postpartum Discharge Summary ° °   °Patient Name: Kelly Spence °DOB: 03/26/1990 °MRN: [id number] ° °Date of admission: 04/07/2021 °Delivery date:04/08/2021  °Delivering provider: CHERRY, ANIKA  °Date of discharge: 04/09/2021 ° °Admitting diagnosis: Chronic hypertension affecting pregnancy [O10.919] °Intrauterine pregnancy: [redacted]w[redacted]d     °Secondary diagnosis:  Principal Problem: °  Chronic hypertension affecting pregnancy ° °Additional problems: Obesity in pregnancy    °Discharge diagnosis: Term Pregnancy Delivered and CHTN                                              °Post partum procedures: None °Augmentation: AROM, Pitocin, Cytotec, and IP Foley °Complications: None ° °Hospital course: Induction of Labor With Vaginal Delivery   °31 y.o. yo G1P1001 at [redacted]w[redacted]d was admitted to the hospital 04/07/2021 for induction of labor.  Indication for induction:  Chronic HTN in pregnancy .  Patient had an uncomplicated labor course as follows: °Membrane Rupture Time/Date: 9:24 PM ,04/07/2021   °Delivery Method:Vaginal, Spontaneous  °Episiotomy: None  °Lacerations:  None  °Details of delivery can be found in separate delivery note.  Patient had a routine postpartum course. Patient is discharged home 04/09/21. ° °Newborn Data: °Birth date:04/08/2021  °Birth time:5:49 AM  °Gender:Female  °Living status:Living  °Apgars:8 ,9  °Weight:3000 g  ° °Magnesium Sulfate received: No °BMZ received: No °Rhophylac:No °MMR:No °T-DaP:Given prenatally °Flu: No (declined) °Transfusion:No ° °Physical exam  °Vitals:  ° 04/08/21 2320 04/09/21 0344 04/09/21 0831 04/09/21 1147  °BP: 133/87 121/85 121/70 109/64  °Pulse: 86 84 83 80  °Resp: 20 18 20 18  °Temp: 98.3 °F (36.8 °C) 98.1 °F (36.7 °C) 98.1 °F (36.7 °C) 99.1 °F (37.3 °C)  °TempSrc: Oral Oral Oral Oral  °SpO2: 97% 98% 100% 98%  °Weight:      °Height:      ° °General: alert, cooperative, and no distress °Lochia: appropriate °Uterine Fundus: firm °Incision: N/A °DVT Evaluation: No evidence of DVT seen  on physical exam. Negative Homan's sign. °No cords or calf tenderness. No significant calf/ankle edema. ° °Labs: °Lab Results  °Component Value Date  ° WBC 11.3 (H) 04/09/2021  ° HGB 9.5 (L) 04/09/2021  ° HCT 29.2 (L) 04/09/2021  ° MCV 82.7 04/09/2021  ° PLT 247 04/09/2021  ° °CMP Latest Ref Rng & Units 04/01/2021  °Glucose 70 - 99 mg/dL 62(L)  °BUN 6 - 20 mg/dL 11  °Creatinine 0.57 - 1.00 mg/dL 0.70  °Sodium 134 - 144 mmol/L 138  °Potassium 3.5 - 5.2 mmol/L 4.4  °Chloride 96 - 106 mmol/L 104  °CO2 20 - 29 mmol/L 18(L)  °Calcium 8.7 - 10.2 mg/dL 9.3  °Total Protein 6.0 - 8.5 g/dL 6.8  °Total Bilirubin 0.0 - 1.2 mg/dL <0.2  °Alkaline Phos 44 - 121 IU/L 132(H)  °AST 0 - 40 IU/L 14  °ALT 0 - 32 IU/L 17  ° °Edinburgh Score: °Edinburgh Postnatal Depression Scale Screening Tool 04/08/2021  °I have been able to laugh and see the funny side of things. 0  °I have looked forward with enjoyment to things. 0  °I have blamed myself unnecessarily when things went wrong. 0  °I have been anxious or worried for no good reason. 0  °I have felt scared or panicky for no good reason. 0  °Things have been getting on top of me. 0  °I have been so unhappy that   that I have had difficulty sleeping. 0  I have felt sad or miserable. 0  I have been so unhappy that I have been crying. 0  The thought of harming myself has occurred to me. 0  Edinburgh Postnatal Depression Scale Total 0     After visit meds:  Allergies as of 04/09/2021       Reactions   Chocolate Hives, Itching, Rash   Nickel Hives, Itching, Rash        Medication List     STOP taking these medications    aspirin EC 81 MG tablet   clobetasol ointment 0.05 % Commonly known as: TEMOVATE   doxylamine (Sleep) 25 MG tablet Commonly known as: UNISOM   ondansetron 4 MG tablet Commonly known as: ZOFRAN       TAKE these medications    ferrous sulfate 325 (65 FE) MG tablet Commonly known as: FerrouSul Take 1 tablet (325 mg total) by mouth daily with  breakfast.   ibuprofen 600 MG tablet Commonly known as: ADVIL Take 1 tablet (600 mg total) by mouth every 6 (six) hours as needed.   multivitamin-prenatal 27-0.8 MG Tabs tablet Take 1 tablet by mouth daily at 12 noon.   NIFEdipine 30 MG 24 hr tablet Commonly known as: ADALAT CC Take 1 tablet (30 mg total) by mouth 2 (two) times daily.         Discharge home in stable condition Infant Feeding: Breast Infant Disposition:home with mother Discharge instruction: per After Visit Summary and Postpartum booklet. Activity: Advance as tolerated. Pelvic rest for 6 weeks.  Diet: routine diet Anticipated Birth Control: PP Depo given Postpartum Appointment:6 weeks Additional Postpartum F/U: Postpartum Depression checkup Future Appointments: Future Appointments  Date Time Provider Central  07/23/2021  9:00 AM Harlin Heys, MD [facility] None   Follow up Visit:  Follow-up Information     [name], MD. Schedule an appointment as soon as possible for a visit.   Specialties: Obstetrics and Gynecology, Radiology Why: 2-3 week mood check televisit 6 weeks postpartum visit Contact information: [address] [place] [address] [phone]                     04/09/2021 [name], MD

## 2021-04-09 NOTE — Consult Note (Signed)
Brief Pharmacy Note  Pharmacy has been consulted for our anti-hypertensive meds to beds program.   Patient has agreed to the program, and is aware she will be responsible for her regular outpatient prescription copay.   The patient's preferred pharmacy in [place] has been changed to [place] (so the electronic prescriptions can be sent there instead of their prior designated pharmacy.)    The electronic rx must be sent to employee pharmacy by 4:30pm the day of discharge for the meds to beds delivery to be possible.  Thank you for including pharmacy in this patient's care.  Darrick Penna, Clinical Pharmacist 04/09/2021 2:28 PM

## 2021-04-13 ENCOUNTER — Telehealth: Payer: Self-pay | Admitting: Obstetrics and Gynecology

## 2021-04-13 NOTE — Telephone Encounter (Signed)
Pt called and stated that she was seen at urgent care and tested positive for Covid. She was wondering if everything would be okay being around her newborn. Stated her baby was born 04/08/2021. Please advise.

## 2021-05-13 NOTE — Progress Notes (Signed)
Patient presents for 1 week postpartum BP check.  Patient is on Procardia 30 mg BID. BP 151/89, Repeat 135/82.  Continue medication. To follow up for postpartum visit as scheduled.

## 2021-05-15 ENCOUNTER — Other Ambulatory Visit: Payer: Self-pay

## 2021-05-15 ENCOUNTER — Encounter: Payer: Self-pay | Admitting: Obstetrics and Gynecology

## 2021-05-15 ENCOUNTER — Ambulatory Visit (INDEPENDENT_AMBULATORY_CARE_PROVIDER_SITE_OTHER): Payer: Commercial Managed Care - PPO | Admitting: Obstetrics and Gynecology

## 2021-05-15 VITALS — BP 135/82 | HR 86 | Ht 70.0 in | Wt 285.7 lb

## 2021-05-15 DIAGNOSIS — O1093 Unspecified pre-existing hypertension complicating the puerperium: Secondary | ICD-10-CM

## 2021-05-15 DIAGNOSIS — O10919 Unspecified pre-existing hypertension complicating pregnancy, unspecified trimester: Secondary | ICD-10-CM

## 2021-05-28 ENCOUNTER — Ambulatory Visit (INDEPENDENT_AMBULATORY_CARE_PROVIDER_SITE_OTHER): Payer: Commercial Managed Care - PPO | Admitting: Obstetrics and Gynecology

## 2021-05-28 ENCOUNTER — Encounter: Payer: Self-pay | Admitting: Obstetrics and Gynecology

## 2021-05-28 ENCOUNTER — Other Ambulatory Visit: Payer: Self-pay

## 2021-05-28 DIAGNOSIS — I10 Essential (primary) hypertension: Secondary | ICD-10-CM

## 2021-05-28 DIAGNOSIS — Z6841 Body Mass Index (BMI) 40.0 and over, adult: Secondary | ICD-10-CM | POA: Diagnosis not present

## 2021-05-28 MED ORDER — NIFEDIPINE ER 30 MG PO TB24: 30.0000 mg | ORAL_TABLET | Freq: Two times a day (BID) | ORAL | 1 refills | Status: AC

## 2021-05-28 NOTE — Patient Instructions (Signed)
Preventive Breast Self-Awareness Breast self-awareness is knowing how your breasts look and feel. Doing breast self-awareness is important. It allows you to catch a breast problem early while it is still small and can be treated. All women should do breast self-awareness, including women who have had breast implants. Tell your doctor if you notice a change in your breasts. What you need: A mirror. A well-lit room. How to do a breast self-exam A breast self-exam is one way to learn what is normal for your breasts and to check for changes. To do a breast self-exam: Look for changes  Take off all the clothes above your waist. Stand in front of a mirror in a room with good lighting. Put your hands on your hips. Push your hands down. Look at your breasts and nipples in the mirror to see if one breast or nipple looks different from the other. Check to see if: The shape of one breast is different. The size of one breast is different. There are wrinkles, dips, and bumps in one breast and not the other. Look at each breast for changes in the skin, such as: Redness. Scaly areas. Look for changes in your nipples, such as: Liquid around the nipples. Bleeding. Dimpling. Redness. A change in where the nipples are. Feel for changes  Lie on your back on the floor. Feel each breast. To do this, follow these steps: Pick a breast to feel. Put the arm closest to that breast above your head. Use your other arm to feel the nipple area of your breast. Feel the area with the pads of your three middle fingers by making small circles with your fingers. For the first circle, press lightly. For the second circle, press harder. For the third circle, press even harder. Keep making circles with your fingers at the different pressures as you move down your breast. Stop when you feel your ribs. Move your fingers a little toward the center of your body. Start making circles with your fingers again, this time going  up until you reach your collarbone. Keep making up-and-down circles until you reach your armpit. Remember to keep using the three pressures. Feel the other breast in the same way. Sit or stand in the tub or shower. With soapy water on your skin, feel each breast the same way you did in step 2 when you were lying on the floor. Write down what you find Writing down what you find can help you remember what to tell your doctor. Write down: What is normal for each breast. Any changes you find in each breast, including: The kind of changes you find. Whether you have pain. Size and location of any lumps. When you last had your menstrual period. General tips Check your breasts every month. If you are breastfeeding, the best time to check your breasts is after you feed your baby or after you use a breast pump. If you get menstrual periods, the best time to check your breasts is 5-7 days after your menstrual period is over. With time, you will become comfortable with the self-exam, and you will begin to know if there are changes in your breasts. Contact a doctor if you: See a change in the shape or size of your breasts or nipples. See a change in the skin of your breast or nipples, such as red or scaly skin. Have fluid coming from your nipples that is not normal. Find a lump or thick area that was not there before. Have pain  in your breasts. Have any concerns about your breast health. Summary Breast self-awareness includes looking for changes in your breasts, as well as feeling for changes within your breasts. Breast self-awareness should be done in front of a mirror in a well-lit room. You should check your breasts every month. If you get menstrual periods, the best time to check your breasts is 5-7 days after your menstrual period is over. Let your doctor know of any changes you see in your breasts, including changes in size, changes on the skin, pain or tenderness, or fluid from your nipples  that is not normal. This information is not intended to replace advice given to you by your health care provider. Make sure you discuss any questions you have with your health care provider. Document Revised: 10/25/2017 Document Reviewed: 10/25/2017 Elsevier Patient Education  [place] 21-69 Years Old, Female Preventive care refers to lifestyle choices and visits with your health care provider that can promote health and wellness. Preventive care visits are also called wellness exams. What can I expect for my preventive care visit? Counseling During your preventive care visit, your health care provider may ask about your: Medical history, including: Past medical problems. Family medical history. Pregnancy history. Current health, including: Menstrual cycle. Method of birth control. Emotional well-being. Home life and relationship well-being. Sexual activity and sexual health. Lifestyle, including: Alcohol, nicotine or tobacco, and drug use. Access to firearms. Diet, exercise, and sleep habits. Work and work Statistician. Sunscreen use. Safety issues such as seatbelt and bike helmet use. Physical exam Your health care provider may check your: Height and weight. These may be used to calculate your BMI (body mass index). BMI is a measurement that tells if you are at a healthy weight. Waist circumference. This measures the distance around your waistline. This measurement also tells if you are at a healthy weight and may help predict your risk of certain diseases, such as type 2 diabetes and high blood pressure. Heart rate and blood pressure. Body temperature. Skin for abnormal spots. What immunizations do I need? Vaccines are usually given at various ages, according to a schedule. Your health care provider will recommend vaccines for you based on your age, medical history, and lifestyle or other factors, such as travel or where you work. What tests do I  need? Screening Your health care provider may recommend screening tests for certain conditions. This may include: Pelvic exam and Pap test. Lipid and cholesterol levels. Diabetes screening. This is done by checking your blood sugar (glucose) after you have not eaten for a while (fasting). Hepatitis B test. Hepatitis C test. HIV (human immunodeficiency virus) test. STI (sexually transmitted infection) testing, if you are at risk. BRCA-related cancer screening. This may be done if you have a family history of breast, ovarian, tubal, or peritoneal cancers. Talk with your health care provider about your test results, treatment options, and if necessary, the need for more tests. Follow these instructions at home: Eating and drinking  Eat a healthy diet that includes fresh fruits and vegetables, whole grains, lean protein, and low-fat dairy products. Take vitamin and mineral supplements as recommended by your health care provider. Do not drink alcohol if: Your health care provider tells you not to drink. You are pregnant, may be pregnant, or are planning to become pregnant. If you drink alcohol: Limit how much you have to 0-1 drink a day. Know how much alcohol is in your drink. In the [place] one drink equals one 12 oz  bottle of beer (355 mL), one 5 oz glass of wine (148 mL), or one 1 oz glass of hard liquor (44 mL). Lifestyle Brush your teeth every morning and night with fluoride toothpaste. Floss one time each day. Exercise for at least 30 minutes 5 or more days each week. Do not use any products that contain nicotine or tobacco. These products include cigarettes, chewing tobacco, and vaping devices, such as e-cigarettes. If you need help quitting, ask your health care provider. Do not use drugs. If you are sexually active, practice safe sex. Use a condom or other form of protection to prevent STIs. If you do not wish to become pregnant, use a form of birth control. If you plan to become  pregnant, see your health care provider for a prepregnancy visit. Find healthy ways to manage stress, such as: Meditation, yoga, or listening to music. Journaling. Talking to a trusted person. Spending time with friends and family. Minimize exposure to UV radiation to reduce your risk of skin cancer. Safety Always wear your seat belt while driving or riding in a vehicle. Do not drive: If you have been drinking alcohol. Do not ride with someone who has been drinking. If you have been using any mind-altering substances or drugs. While texting. When you are tired or distracted. Wear a helmet and other protective equipment during sports activities. If you have firearms in your house, make sure you follow all gun safety procedures. Seek help if you have been physically or sexually abused. What's next? Go to your health care provider once a year for an annual wellness visit. Ask your health care provider how often you should have your eyes and teeth checked. Stay up to date on all vaccines. This information is not intended to replace advice given to you by your health care provider. Make sure you discuss any questions you have with your health care provider. Document Revised: 09/03/2020 Document Reviewed: 09/03/2020 Elsevier Patient Education  [place].

## 2021-05-28 NOTE — Progress Notes (Signed)
? ?  OBSTETRICS POSTPARTUM CLINIC PROGRESS NOTE ? ?Subjective:  ?  ? Kelly Spence is a 31 y.o. G39P1001 female who presents for a postpartum visit. She is 7 weeks postpartum following a spontaneous vaginal delivery. I have fully reviewed the prenatal and intrapartum course. The delivery was at 44 gestational weeks. Pregnancy was complicated by chronic hypertension in pregnancy, for which she underwent an induction.  Anesthesia: epidural. Postpartum course has been uncomplicated. Baby's course has been uncomplicated. Baby is feeding by bottle - Fawn Kirk Soothe Pro . Bleeding: patient has resumed menses, with No LMP recorded (lmp unknown).. Bowel function is normal. Bladder function is normal. Patient is sexually active. Contraception method desired is Depo-Provera injections.  EDPS score is 0.  ? ? ?The following portions of the patient's history were reviewed and updated as appropriate: allergies, current medications, past family history, past medical history, past social history, past surgical history, and problem list. ? ?Review of Systems ?Pertinent items noted in HPI and remainder of comprehensive ROS otherwise negative.  ? ?Objective:  ? ? BP (!) 141/88 (BP Location: Right Arm, Patient Position: Sitting)   Pulse 81   Ht '5\' 10"'$  (1.778 m)   Wt 285 lb 8 oz (129.5 kg)   LMP  (LMP Unknown)   Breastfeeding No   BMI 40.96 kg/m?   ?General:  alert and no distress  ? Breasts:  inspection negative, no nipple discharge or bleeding, no masses or nodularity palpable  ?Lungs: clear to auscultation bilaterally  ?Heart:  regular rate and rhythm, S1, S2 normal, no murmur, click, rub or gallop  ?Abdomen: soft, non-tender; bowel sounds normal; no masses,  no organomegaly.    ? Vulva:  normal  ?Vagina: normal vagina, no discharge, exudate, lesion, or erythema  ?Cervix:  no cervical motion tenderness and no lesions  ?Corpus: normal size, contour, position, consistency, mobility, non-tender  ?Adnexa:  normal adnexa  and no mass, fullness, tenderness  ?Rectal Exam: Not performed.  ?      ? ?Labs:  ?Lab Results  ?Component Value Date  ? HGB 9.5 (L) 04/09/2021  ? ? ?Assessment:  ? ?1. Postpartum care following vaginal delivery   ?2. Essential hypertension   ?3. Class 3 severe obesity with body mass index (BMI) of 40.0 to 44.9 in adult, unspecified obesity type, unspecified whether serious comorbidity present ([place])   ?  ? ?Plan:  ? ? 1. Contraception: Depo-Provera injections ?2. Patient currently on Nifedipine 30 mg BID, BPs mildly uncontrolled.  Advised to continue medication for now.  Has an appointment soon with her PCP for continued management.  ?3. Follow up in: 1 month for Depo injection.  Can f/u with PCP or GYN for routing gynecologic care.  ? ? ? ?[name], MD ?[facility] ? ?

## 2021-06-09 ENCOUNTER — Encounter: Payer: Self-pay | Admitting: Obstetrics and Gynecology

## 2021-06-19 ENCOUNTER — Encounter: Payer: Commercial Managed Care - PPO | Admitting: Obstetrics and Gynecology

## 2021-06-29 ENCOUNTER — Ambulatory Visit: Payer: Medicaid Other

## 2021-06-29 ENCOUNTER — Ambulatory Visit (INDEPENDENT_AMBULATORY_CARE_PROVIDER_SITE_OTHER): Payer: Medicaid Other | Admitting: Obstetrics and Gynecology

## 2021-06-29 DIAGNOSIS — Z3042 Encounter for surveillance of injectable contraceptive: Secondary | ICD-10-CM

## 2021-06-29 MED ORDER — MEDROXYPROGESTERONE ACETATE 150 MG/ML IM SUSP
150.0000 mg | INTRAMUSCULAR | Status: AC
  Administered 2021-06-29 – 2021-09-25 (×2): 150 mg via INTRAMUSCULAR

## 2021-06-29 MED ORDER — MEDROXYPROGESTERONE ACETATE 150 MG/ML IM SUSP: 150.0000 mg | INTRAMUSCULAR | 3 refills | Status: AC

## 2021-06-29 NOTE — Patient Instructions (Signed)
Medroxyprogesterone Injection (Contraception) ?What is this medication? ?MEDROXYPROGESTERONE (me DROX ee proe JES te rone) prevents ovulation and pregnancy. It belongs to a group of medications called contraceptives. This medication is a progestin hormone. ?This medicine may be used for other purposes; ask your health care provider or pharmacist if you have questions. ?COMMON BRAND NAME(S): Depo-Provera, Depo-subQ Provera 104 ?What should I tell my care team before I take this medication? ?They need to know if you have any of these conditions: ?Asthma ?Blood clots ?Breast cancer or family history of breast cancer ?Depression ?Diabetes ?Eating disorder (anorexia nervosa) ?Heart attack ?High blood pressure ?HIV infection or AIDS ?If you often drink alcohol ?Kidney disease ?Liver disease ?Migraine headaches ?Osteoporosis, weak bones ?Seizures ?Stroke ?Tobacco smoker ?Vaginal bleeding ?An unusual or allergic reaction to medroxyprogesterone, other hormones, medications, foods, dyes, or preservatives ?Pregnant or trying to get pregnant ?Breast-feeding ?How should I use this medication? ?Depo-Provera CI contraceptive injection is given into a muscle. Depo-subQ Provera 104 injection is given under the skin. It is given in a hospital or clinic setting. The injection is usually given during the first 5 days after the start of a menstrual period or 6 weeks after delivery of a baby. ?A patient package insert for the product will be given with each prescription and refill. Be sure to read this information carefully each time. The sheet may change often. ?Talk to your care team about the use of this medication in children. Special care may be needed. These injections have been used in female children who have started having menstrual periods. ?Overdosage: If you think you have taken too much of this medicine contact a poison control center or emergency room at once. ?NOTE: This medicine is only for you. Do not share this medicine  with others. ?What if I miss a dose? ?Keep appointments for follow-up doses. You must get an injection once every 3 months. It is important not to miss your dose. Call your care team if you are unable to keep an appointment. ?What may interact with this medication? ?Antibiotics or medications for infections, especially rifampin and griseofulvin ?Antivirals for HIV or hepatitis ?Aprepitant ?Armodafinil ?Bexarotene ?Bosentan ?Medications for seizures like carbamazepine, felbamate, oxcarbazepine, phenytoin, phenobarbital, primidone, topiramate ?Mitotane ?Modafinil ?St. John's wort ?This list may not describe all possible interactions. Give your health care provider a list of all the medicines, herbs, non-prescription drugs, or dietary supplements you use. Also tell them if you smoke, drink alcohol, or use illegal drugs. Some items may interact with your medicine. ?What should I watch for while using this medication? ?This medication does not protect you against HIV infection (AIDS) or other sexually transmitted diseases. ?Use of this product may cause you to lose calcium from your bones. Loss of calcium may cause weak bones (osteoporosis). Only use this product for more than 2 years if other forms of birth control are not right for you. The longer you use this product for birth control the more likely you will be at risk for weak bones. Ask your care team how you can keep strong bones. ?You may have a change in bleeding pattern or irregular periods. Many females stop having periods while taking this medication. ?If you have received your injections on time, your chance of being pregnant is very low. If you think you may be pregnant, see your care team as soon as possible. ?Tell your care team if you want to get pregnant within the next year. The effect of this medication may last a   long time after you get your last injection. ?What side effects may I notice from receiving this medication? ?Side effects that you should  report to your care team as soon as possible: ?Allergic reactions--skin rash, itching, hives, swelling of the face, lips, tongue, or throat ?Blood clot--pain, swelling, or warmth in the leg, shortness of breath, chest pain ?Gallbladder problems--severe stomach pain, nausea, vomiting, fever ?Increase in blood pressure ?Liver injury--right upper belly pain, loss of appetite, nausea, light-colored stool, dark yellow or brown urine, yellowing skin or eyes, unusual weakness or fatigue ?New or worsening migraines or headaches ?Seizures ?Stroke--sudden numbness or weakness of the face, arm, or leg, trouble speaking, confusion, trouble walking, loss of balance or coordination, dizziness, severe headache, change in vision ?Unusual vaginal discharge, itching, or odor ?Worsening mood, feelings of depression ?Side effects that usually do not require medical attention (report to your care team if they continue or are bothersome): ?Breast pain or tenderness ?Dark patches of the skin on the face or other sun-exposed areas ?Irregular menstrual cycles or spotting ?Nausea ?Weight gain ?This list may not describe all possible side effects. Call your doctor for medical advice about side effects. You may report side effects to FDA at 1-800-FDA-1088. ?Where should I keep my medication? ?This injection is only given by a care team. It will not be stored at home. ?NOTE: This sheet is a summary. It may not cover all possible information. If you have questions about this medicine, talk to your doctor, pharmacist, or health care provider. ?? 2022 Elsevier/Gold Standard (2020-05-11 00:00:00) ? ?

## 2021-06-29 NOTE — Progress Notes (Signed)
Date last pap: 06/20/2020. ?Last Depo-Provera: 04/09/2021. ?Side Effects if any: None. ?Serum HCG indicated? N/A. ?Depo-Provera 150 mg IM given by: Cristy Folks, CMA. ?Next appointment due : June 26 - July 10. Patient may start getting injections at her PCP.  ?

## 2021-07-23 ENCOUNTER — Encounter: Payer: Commercial Managed Care - PPO | Admitting: Obstetrics and Gynecology

## 2021-07-23 DIAGNOSIS — N870 Mild cervical dysplasia: Secondary | ICD-10-CM

## 2021-07-23 DIAGNOSIS — Z01419 Encounter for gynecological examination (general) (routine) without abnormal findings: Secondary | ICD-10-CM

## 2021-07-23 DIAGNOSIS — Z124 Encounter for screening for malignant neoplasm of cervix: Secondary | ICD-10-CM

## 2021-09-18 ENCOUNTER — Ambulatory Visit: Payer: Medicaid Other

## 2021-09-24 NOTE — Progress Notes (Unsigned)
Date last pap: 06/20/2020. Last Depo-Provera: 06/29/2021 Side Effects if any: None. Serum HCG indicated? N/A. Depo-Provera 150 mg IM given by: Cristy Folks, CMA. Next appointment due : September 22 - Oct. 6  Office supplied depo injection.

## 2021-09-24 NOTE — Patient Instructions (Signed)
Medroxyprogesterone Injection (Contraception) ?What is this medication? ?MEDROXYPROGESTERONE (me DROX ee proe JES te rone) prevents ovulation and pregnancy. It belongs to a group of medications called contraceptives. This medication is a progestin hormone. ?This medicine may be used for other purposes; ask your health care provider or pharmacist if you have questions. ?COMMON BRAND NAME(S): Depo-Provera, Depo-subQ Provera 104 ?What should I tell my care team before I take this medication? ?They need to know if you have any of these conditions: ?Asthma ?Blood clots ?Breast cancer or family history of breast cancer ?Depression ?Diabetes ?Eating disorder (anorexia nervosa) ?Heart attack ?High blood pressure ?HIV infection or AIDS ?If you often drink alcohol ?Kidney disease ?Liver disease ?Migraine headaches ?Osteoporosis, weak bones ?Seizures ?Stroke ?Tobacco smoker ?Vaginal bleeding ?An unusual or allergic reaction to medroxyprogesterone, other hormones, medications, foods, dyes, or preservatives ?Pregnant or trying to get pregnant ?Breast-feeding ?How should I use this medication? ?Depo-Provera CI contraceptive injection is given into a muscle. Depo-subQ Provera 104 injection is given under the skin. It is given in a hospital or clinic setting. The injection is usually given during the first 5 days after the start of a menstrual period or 6 weeks after delivery of a baby. ?A patient package insert for the product will be given with each prescription and refill. Be sure to read this information carefully each time. The sheet may change often. ?Talk to your care team about the use of this medication in children. Special care may be needed. These injections have been used in female children who have started having menstrual periods. ?Overdosage: If you think you have taken too much of this medicine contact a poison control center or emergency room at once. ?NOTE: This medicine is only for you. Do not share this medicine  with others. ?What if I miss a dose? ?Keep appointments for follow-up doses. You must get an injection once every 3 months. It is important not to miss your dose. Call your care team if you are unable to keep an appointment. ?What may interact with this medication? ?Antibiotics or medications for infections, especially rifampin and griseofulvin ?Antivirals for HIV or hepatitis ?Aprepitant ?Armodafinil ?Bexarotene ?Bosentan ?Medications for seizures like carbamazepine, felbamate, oxcarbazepine, phenytoin, phenobarbital, primidone, topiramate ?Mitotane ?Modafinil ?St. John's wort ?This list may not describe all possible interactions. Give your health care provider a list of all the medicines, herbs, non-prescription drugs, or dietary supplements you use. Also tell them if you smoke, drink alcohol, or use illegal drugs. Some items may interact with your medicine. ?What should I watch for while using this medication? ?This medication does not protect you against HIV infection (AIDS) or other sexually transmitted diseases. ?Use of this product may cause you to lose calcium from your bones. Loss of calcium may cause weak bones (osteoporosis). Only use this product for more than 2 years if other forms of birth control are not right for you. The longer you use this product for birth control the more likely you will be at risk for weak bones. Ask your care team how you can keep strong bones. ?You may have a change in bleeding pattern or irregular periods. Many females stop having periods while taking this medication. ?If you have received your injections on time, your chance of being pregnant is very low. If you think you may be pregnant, see your care team as soon as possible. ?Tell your care team if you want to get pregnant within the next year. The effect of this medication may last a   long time after you get your last injection. ?What side effects may I notice from receiving this medication? ?Side effects that you should  report to your care team as soon as possible: ?Allergic reactions--skin rash, itching, hives, swelling of the face, lips, tongue, or throat ?Blood clot--pain, swelling, or warmth in the leg, shortness of breath, chest pain ?Gallbladder problems--severe stomach pain, nausea, vomiting, fever ?Increase in blood pressure ?Liver injury--right upper belly pain, loss of appetite, nausea, light-colored stool, dark yellow or brown urine, yellowing skin or eyes, unusual weakness or fatigue ?New or worsening migraines or headaches ?Seizures ?Stroke--sudden numbness or weakness of the face, arm, or leg, trouble speaking, confusion, trouble walking, loss of balance or coordination, dizziness, severe headache, change in vision ?Unusual vaginal discharge, itching, or odor ?Worsening mood, feelings of depression ?Side effects that usually do not require medical attention (report to your care team if they continue or are bothersome): ?Breast pain or tenderness ?Dark patches of the skin on the face or other sun-exposed areas ?Irregular menstrual cycles or spotting ?Nausea ?Weight gain ?This list may not describe all possible side effects. Call your doctor for medical advice about side effects. You may report side effects to FDA at 1-800-FDA-1088. ?Where should I keep my medication? ?This injection is only given by a care team. It will not be stored at home. ?NOTE: This sheet is a summary. It may not cover all possible information. If you have questions about this medicine, talk to your doctor, pharmacist, or health care provider. ?? 2023 Elsevier/Gold Standard (2020-05-11 00:00:00) ? ?

## 2021-09-25 ENCOUNTER — Ambulatory Visit (INDEPENDENT_AMBULATORY_CARE_PROVIDER_SITE_OTHER): Payer: Medicaid Other | Admitting: Obstetrics and Gynecology

## 2021-09-25 DIAGNOSIS — Z3042 Encounter for surveillance of injectable contraceptive: Secondary | ICD-10-CM

## 2021-11-27 ENCOUNTER — Ambulatory Visit: Payer: Medicaid Other

## 2021-12-14 ENCOUNTER — Ambulatory Visit: Payer: Medicaid Other

## 2021-12-14 NOTE — Progress Notes (Deleted)
Date last pap: 06/20/2020. Last Depo-Provera: 09/25/2021.. Side Effects if any: none. Serum HCG indicated? na. Depo-Provera 150 mg IM given by: Levert Feinstein CMA. Next appointment due [place] 11-DEC 25.

## 2021-12-22 ENCOUNTER — Ambulatory Visit: Payer: Medicaid Other

## 2021-12-22 ENCOUNTER — Ambulatory Visit (INDEPENDENT_AMBULATORY_CARE_PROVIDER_SITE_OTHER): Payer: Medicaid Other

## 2021-12-22 VITALS — BP 130/80 | Ht 70.0 in | Wt 260.0 lb

## 2021-12-22 DIAGNOSIS — Z3042 Encounter for surveillance of injectable contraceptive: Secondary | ICD-10-CM

## 2021-12-22 MED ORDER — MEDROXYPROGESTERONE ACETATE 150 MG/ML IM SUSP
150.0000 mg | Freq: Once | INTRAMUSCULAR | Status: AC
  Administered 2021-12-22: 150 mg via INTRAMUSCULAR

## 2021-12-22 NOTE — Progress Notes (Addendum)
Date last pap: 06/20/2020. Last Depo-Provera: 09/25/21. Side Effects if any: none. Serum HCG indicated? N/a. Depo-Provera 150 mg IM given by: Kaleeyah Cuffie . Next appointment due 03/16/2022.

## 2022-02-05 IMAGING — US US OB < 14 WEEKS - US OB TV
1 series · 15 of 28 positions shown · non-contrast
Comparison: None.

CLINICAL DATA: Assessment of size and dates

EXAM:
OBSTETRIC <14 WK US AND TRANSVAGINAL OB US
TECHNIQUE: Both transabdominal and transvaginal ultrasound examinations were
performed for complete evaluation of the gestation as well as the
maternal uterus, adnexal regions, and pelvic cul-de-sac.
Transvaginal technique was performed to assess early pregnancy.

[Series 1: us ob comp less 14 wks · 15 of 89 slices shown]
[im 1/89]
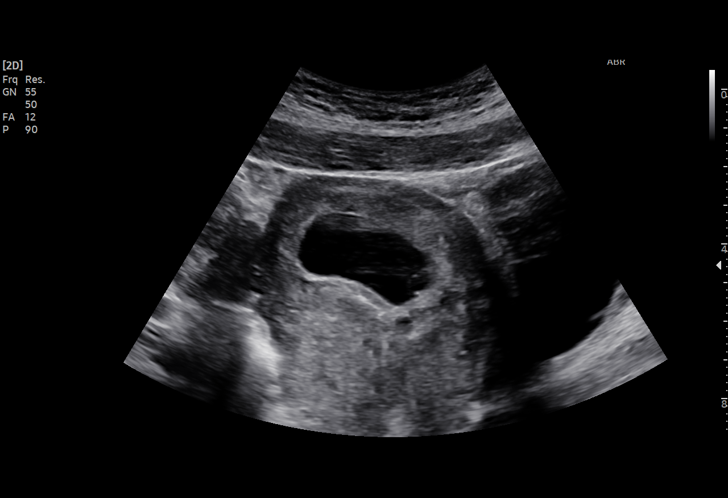
[im 7/89]
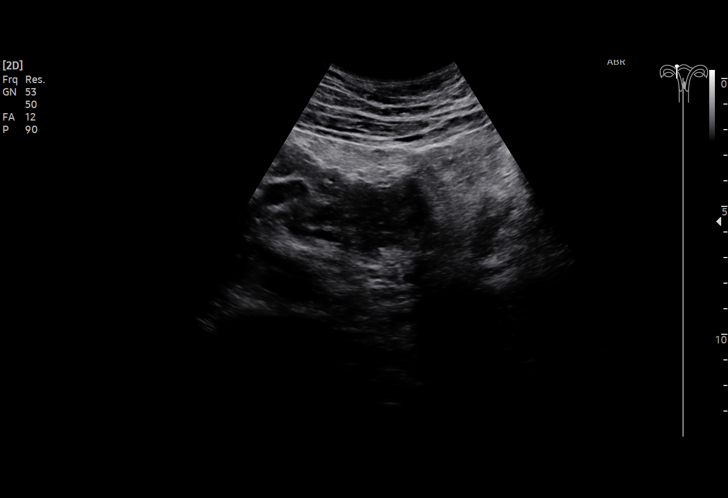
[im 14/89]
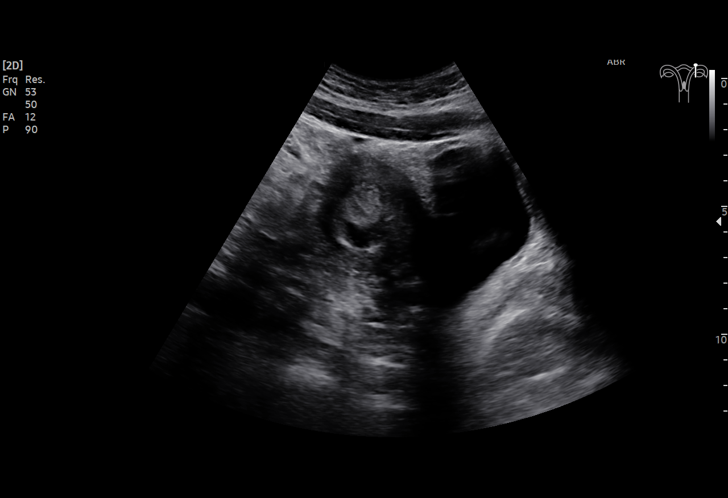
[im 20/89]
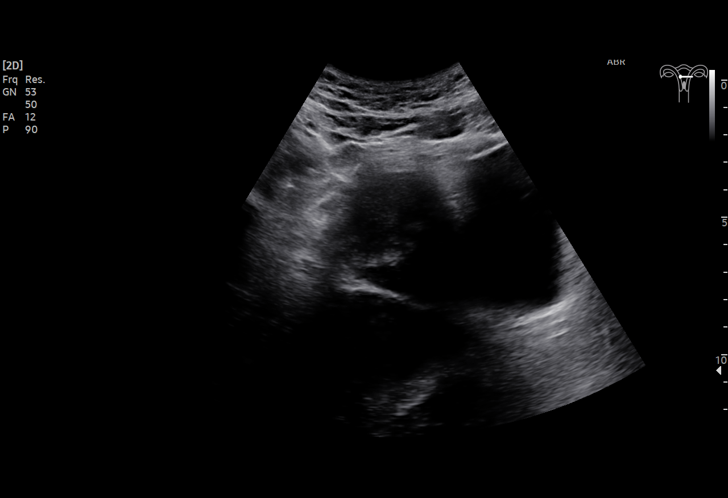
[im 27/89]
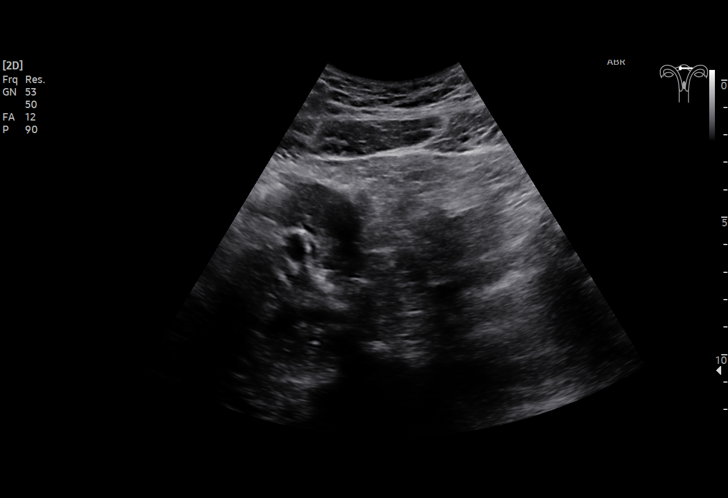
[im 33/89]
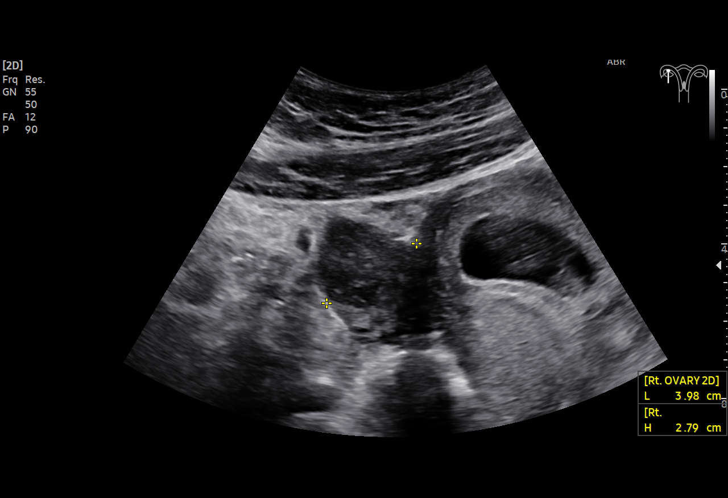
[im 40/89]
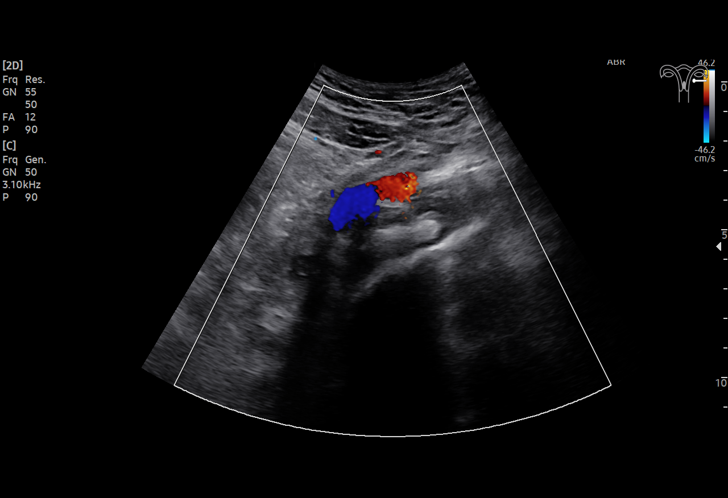
[im 46/89]
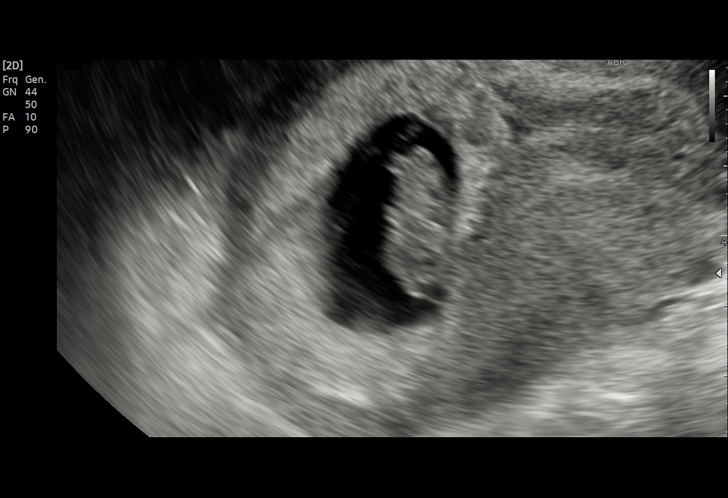
[im 49/89]
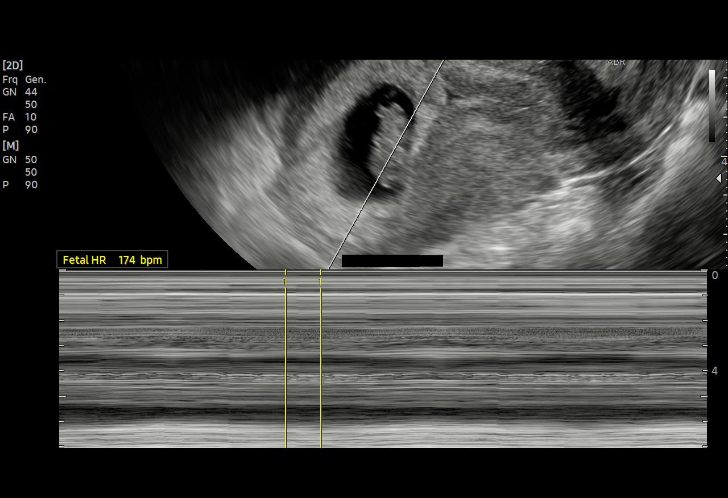
[im 56/89]
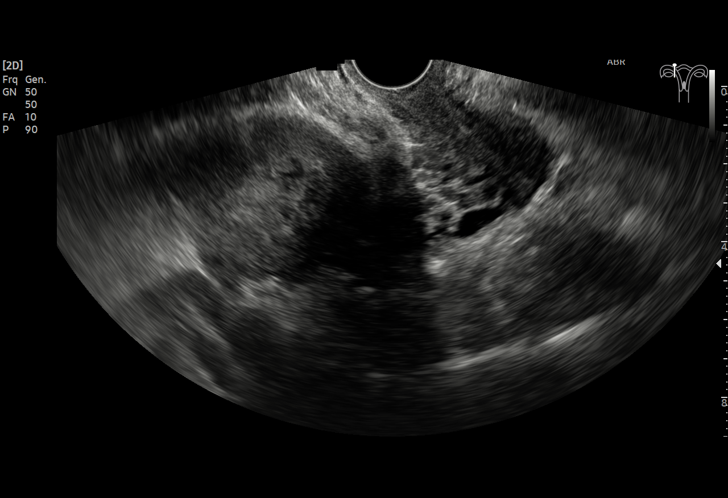
[im 62/89]
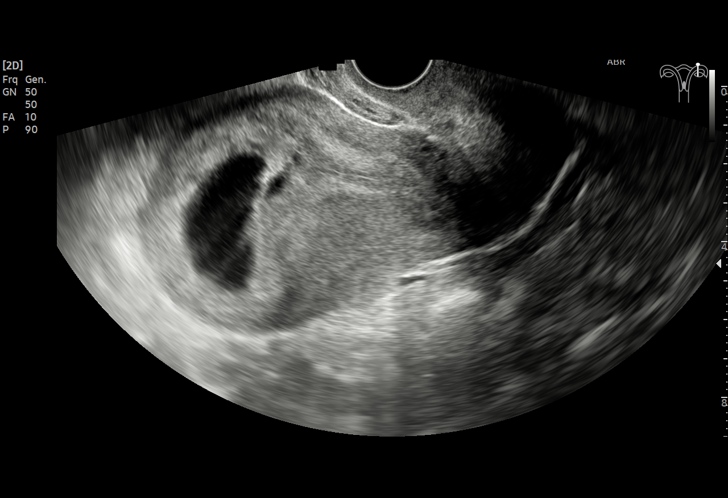
[im 69/89]
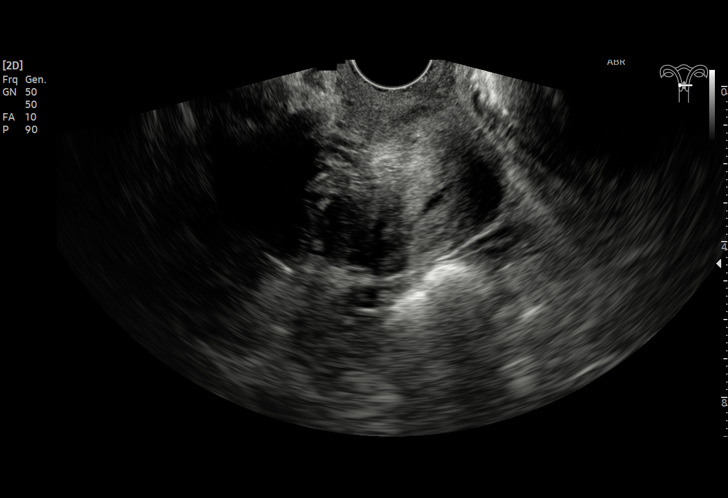
[im 75/89]
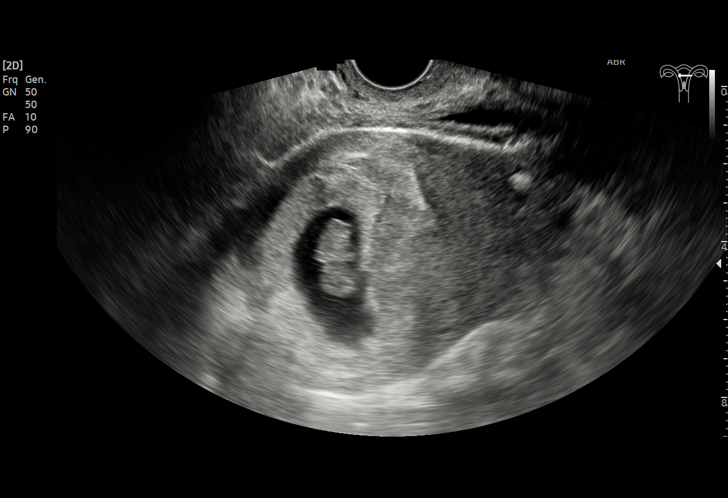
[im 82/89]
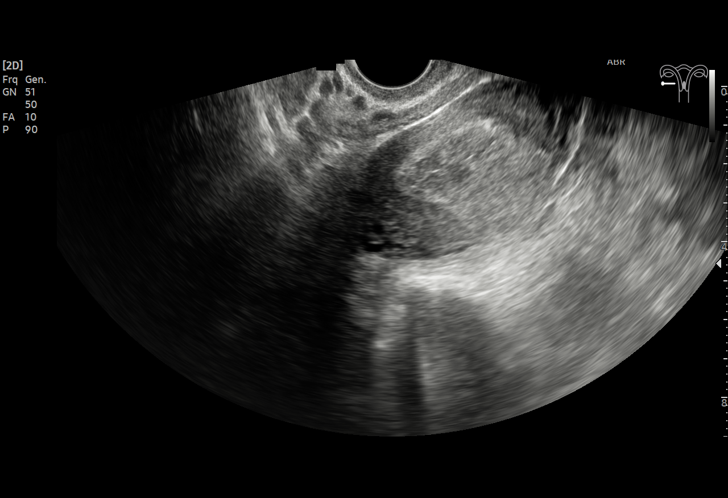
[im 89/89]
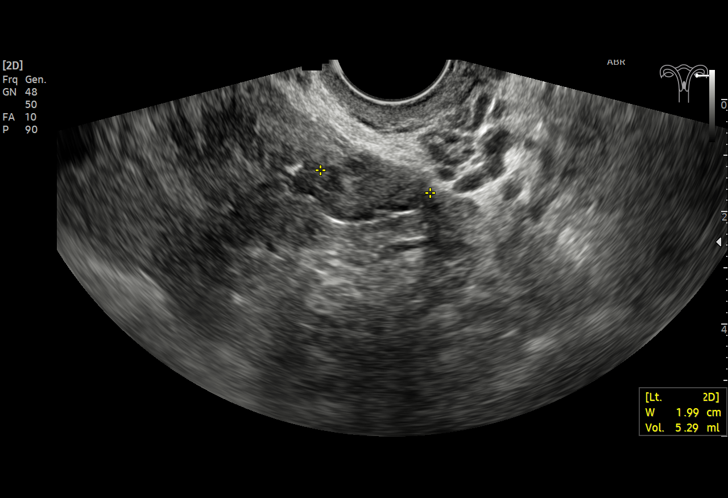

[15 of 28 positions shown; findings below may reference images not displayed]

FINDINGS: Intrauterine gestational sac: Single

Yolk sac:  Visualized.

Embryo:  Visualized.

Cardiac Activity: Visualized.

Heart Rate: 174 bpm

MSD:   mm    w     d

CRL:  21.7 mm   8 w   6 d                  US EDC: April 21, 2021

Subchorionic hemorrhage: 1.5 x 1.7 x 1.1 cm small subchorionic
hemorrhage.

Maternal uterus/adnexae: Normal
IMPRESSION: 1. Single live IUP.
2. Small subchorionic hemorrhage.

## 2022-03-17 ENCOUNTER — Ambulatory Visit: Payer: Medicaid Other

## 2022-06-07 IMAGING — US US MFM OB FOLLOW-UP
1 series · 14 of 28 positions shown · non-contrast
Comparison: none

[Series 1: us mfm ob follow-up · 45 acquisitions, 14 frames shown]
[im 2/45]
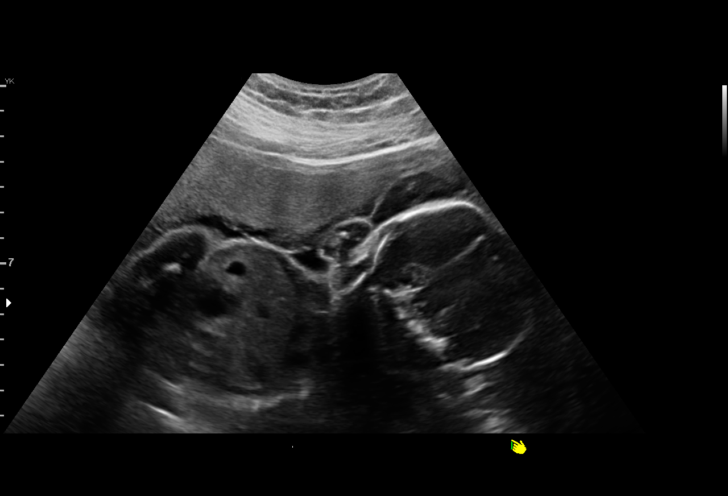
[im 5/45]
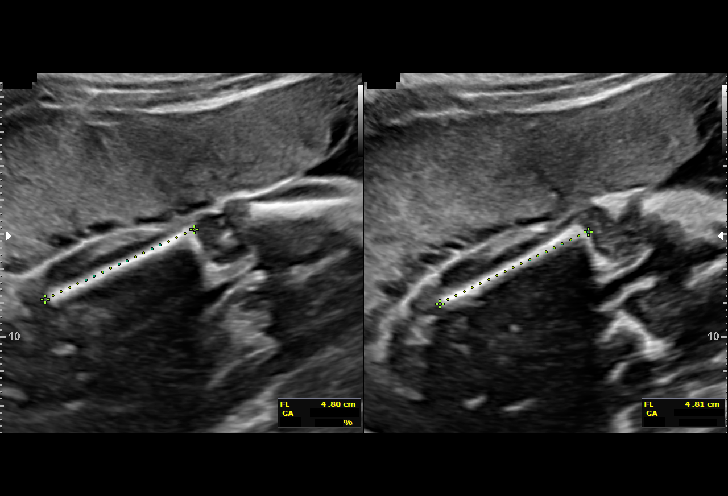
[im 9/45]
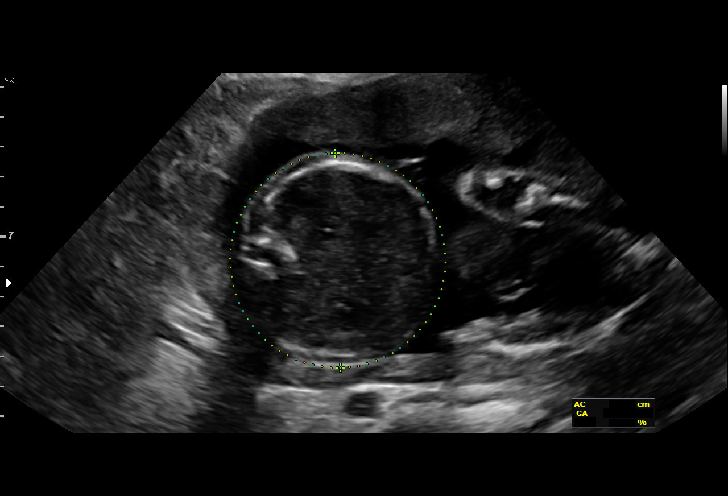
[im 12/45]
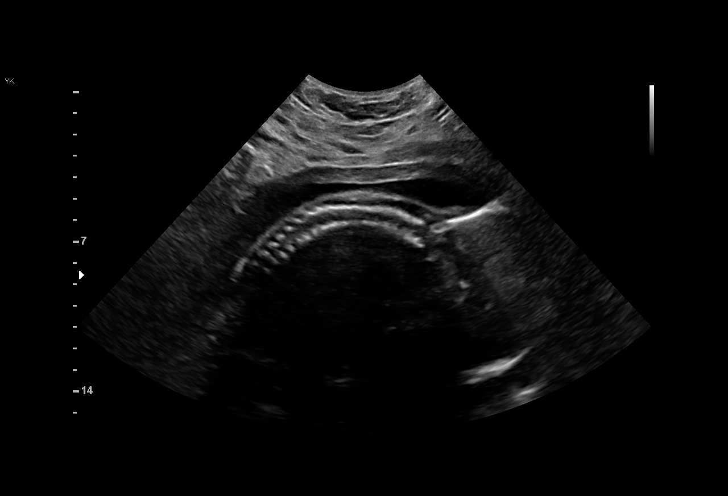
[im 15/45]
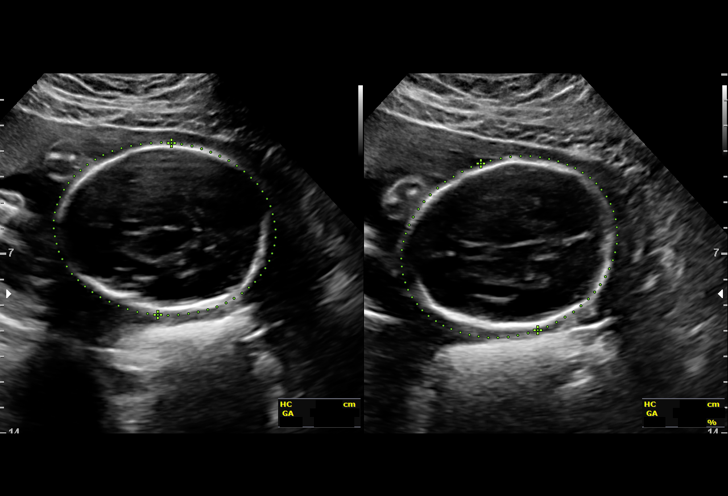
[im 18/45]
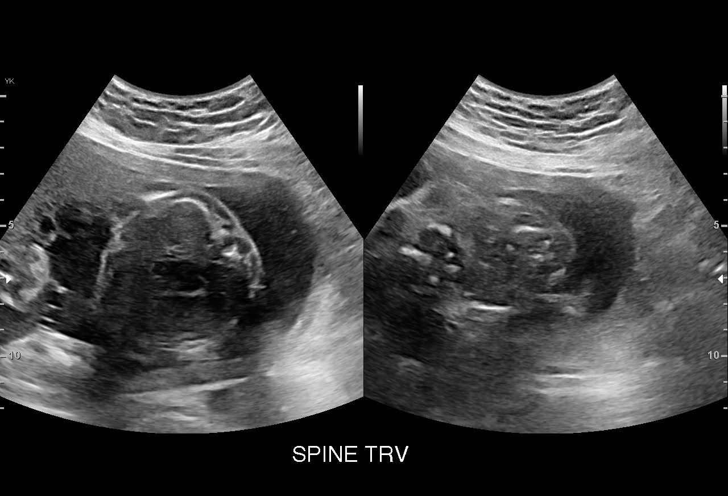
[im 22/45]
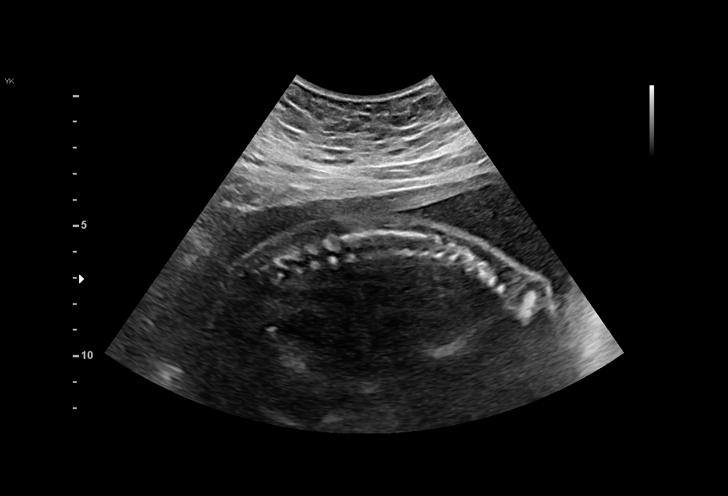
[im 25/45]
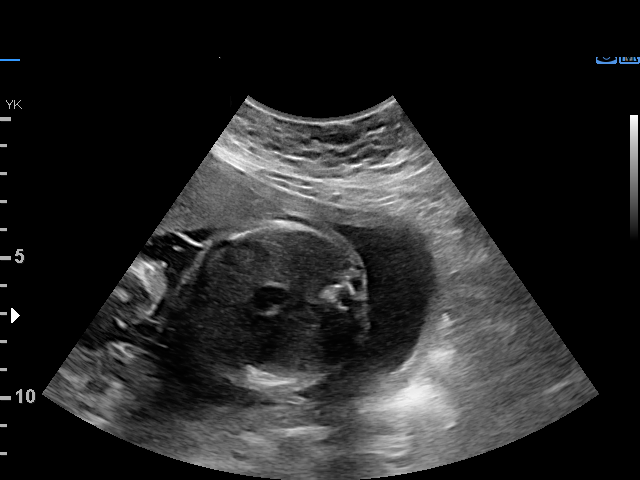
[im 28/45]
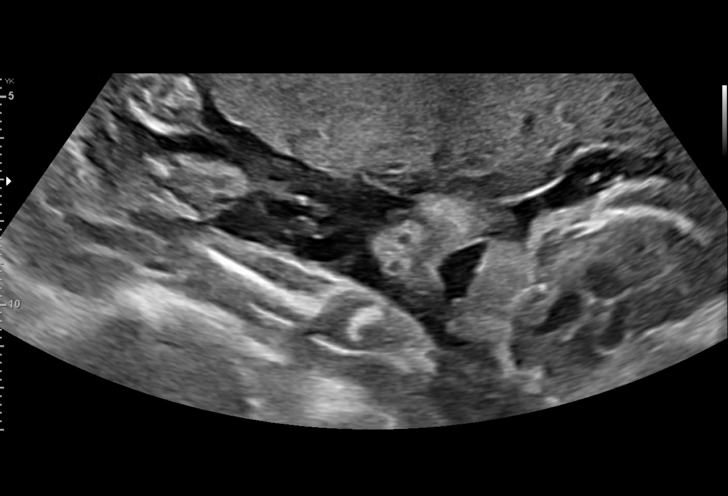
[im 31/45]
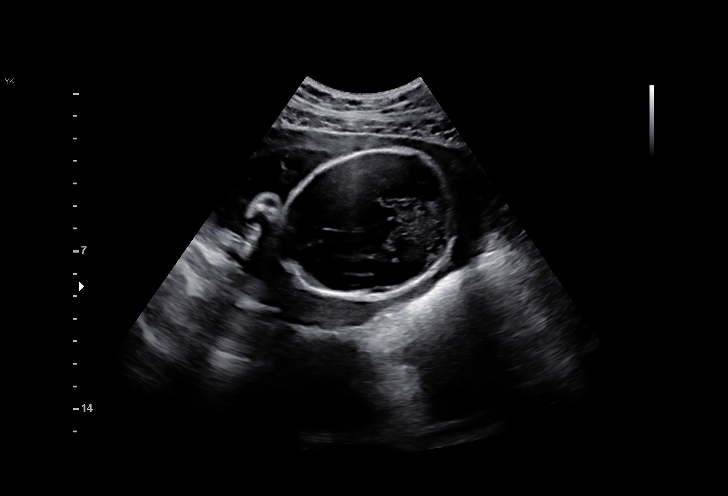
[im 35/45]
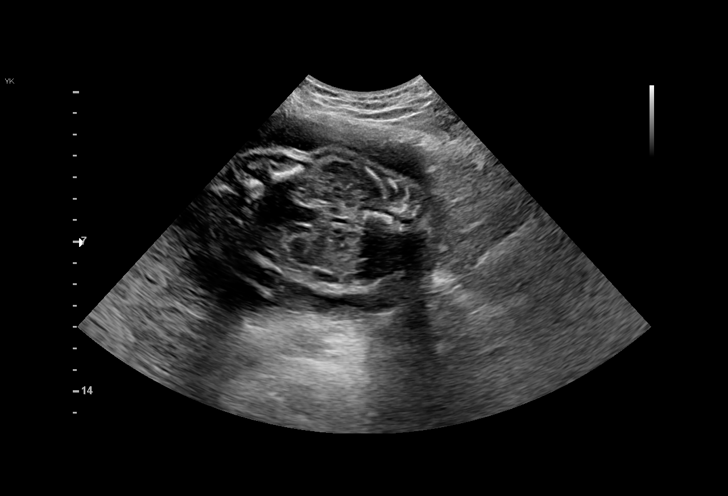
[im 38/45]
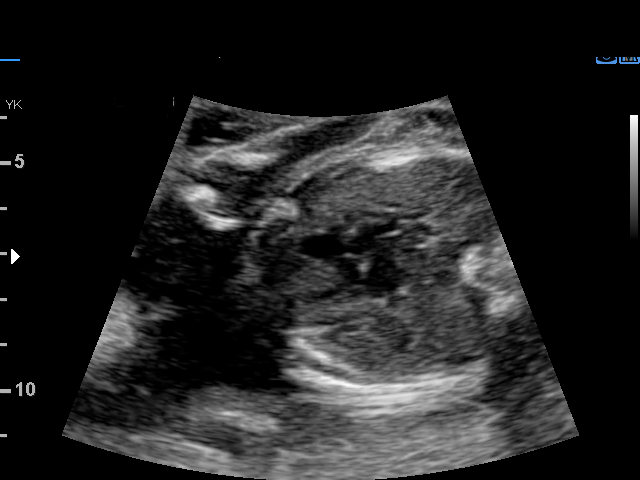
[im 41/45]
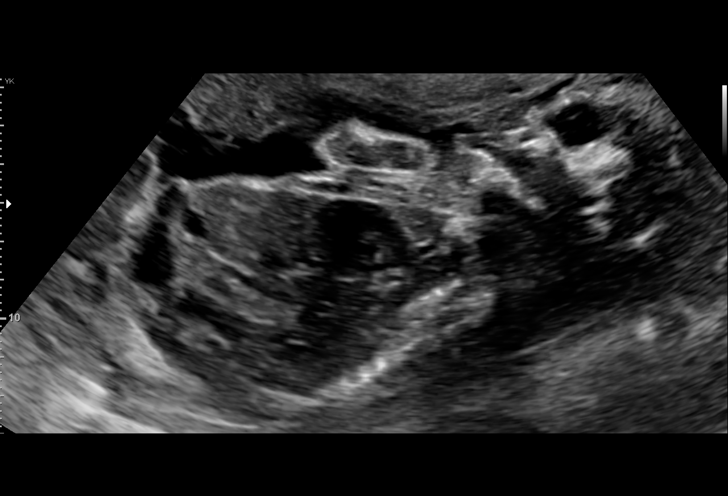
[im 45/45]
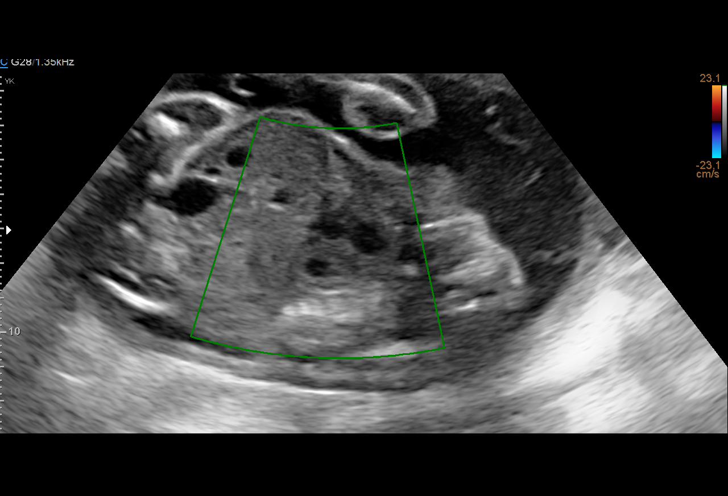

[14 of 28 positions shown; findings below may reference images not displayed]

[REDACTED]
                                                            [id number]

                                                      SUNIL

Indications

 Hypertension - Chronic/Pre-existing
 Obesity complicating pregnancy, second
 trimester
 Encounter for antenatal screening for
 malformations
 Low Risk NIPS, AFP negative
 26 weeks gestation of pregnancy
Fetal Evaluation

 Num Of Fetuses:         1
 Fetal Heart Rate(bpm):  148
 Cardiac Activity:       Observed
 Presentation:           Variable
 Placenta:               Anterior
 P. Cord Insertion:      Visualized, central

 Amniotic Fluid
 AFI FV:      Within normal limits

                             Largest Pocket(cm)

Biometry
 BPD:      63.1  mm     G. Age:  25w 4d         14  %    CI:        68.69   %    70 - 86
                                                         FL/HC:      19.7   %    18.6 -
 HC:      243.3  mm     G. Age:  26w 3d         24  %    HC/AC:      1.08        1.04 -
 AC:      224.9  mm     G. Age:  26w 6d         56  %    FL/BPD:     76.1   %    71 - 87
 FL:         48  mm     G. Age:  26w 1d         25  %    FL/AC:      21.3   %    20 - 24

 Est. FW:     942  gm      2 lb 1 oz     40  %
OB History

 Gravidity:    1
 Living:       0
Gestational Age

 LMP:           26w 3d        Date:  07/14/20                 EDD:   04/20/21
 U/S Today:     26w 2d                                        EDD:   04/21/21
 Best:          26w 3d     Det. By:  LMP  (07/14/20)          EDD:   04/20/21
Anatomy

 Cranium:               Appears normal         Aortic Arch:            Not well visualized
 Cavum:                 Appears normal         Ductal Arch:            Appears normal
 Ventricles:            Appears normal         Diaphragm:              Previously seen
 Choroid Plexus:        Previously seen        Stomach:                Appears normal, left
                                                                       sided
 Cerebellum:            Appears normal         Abdomen:                Appears normal
 Posterior Fossa:       Appears normal         Abdominal Wall:         Appears nml (cord
                                                                       insert, abd wall)
 Nuchal Fold:           Previously seen        Cord Vessels:           Appears normal (3
                                                                       vessel cord)
 Face:                  Orbits and profile     Kidneys:                Appear normal
                        previously seen
 Lips:                  Appears normal         Bladder:                Appears normal
 Thoracic:              Appears normal         Spine:                  Appears normal
 Heart:                 Previously seen        Upper Extremities:      Previously seen
 RVOT:                  Previously seen        Lower Extremities:      Previously seen
 LVOT:                  Previously seen

 Other:  Fetus appears to be female. Technically difficult due to maternal
         habitus and fetal position.
Cervix Uterus Adnexa

 Cervix
 Length:            5.8  cm.
 Normal appearance by transabdominal scan.

 Uterus
 No abnormality visualized.
Impression

 Follow up growth due to prior chronic hypertension on low
 dose ASA and suboptimal views of the fetal anatomy.
 Normal interval growth with measurements consistent with
 dates
 Good fetal movement and amniotic fluid volume
 Suboptimal views of the aortic arch was again seen however
 the other fetal anatomy appears normal.

 She reports no s/sx of preeclampsia.
 Her blood pressure was 140/84, 140/91 and 131/91 mmHg.
Recommendations

 Follow up growth in 4-6 weeks.

## 2022-07-06 IMAGING — CR DG CHEST 2V
1 series · 2 of 2 positions shown · non-contrast
Comparison: None.

CLINICAL DATA: Chest pain and cough.

EXAM:
CHEST - 2 VIEW

[Series 1: dg chest 2 view · 0.14mm/px · 2 of 2 slices shown]
[im 1/2]
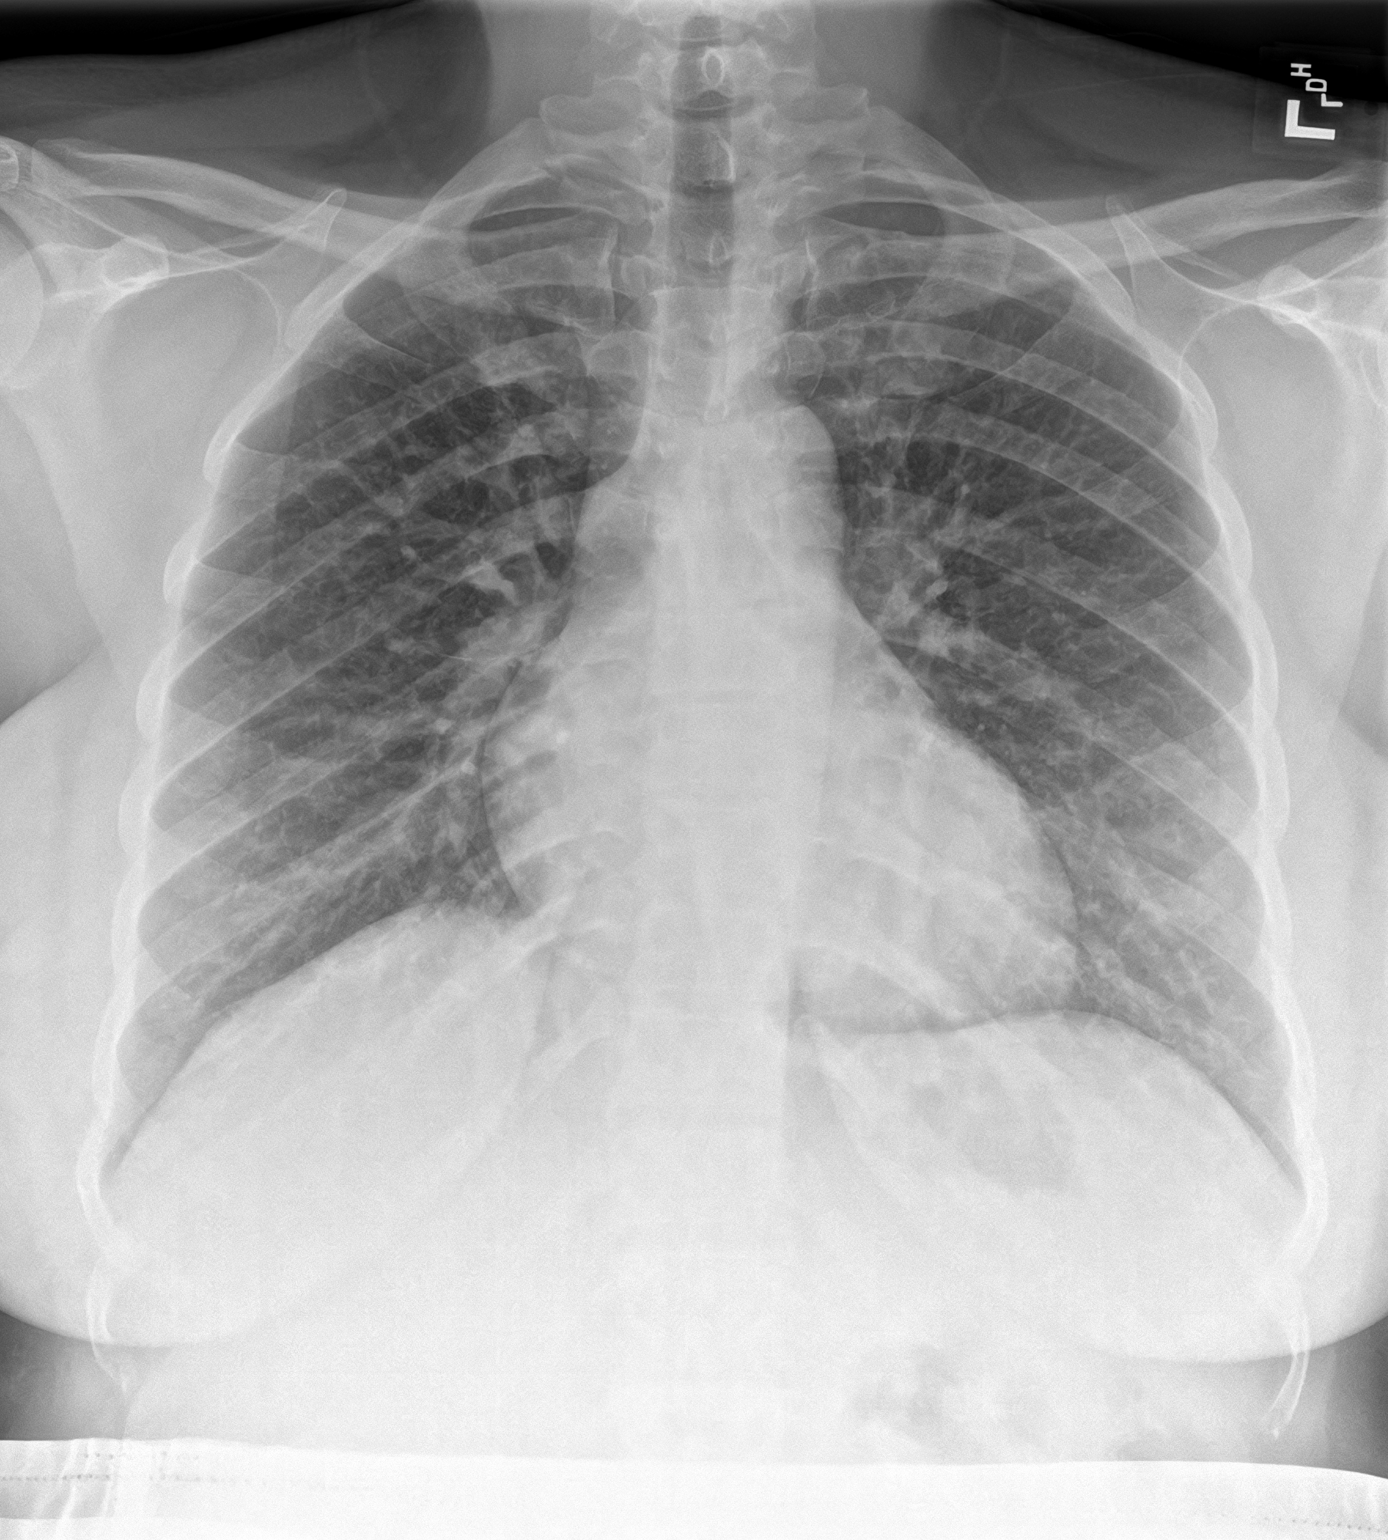
[im 2/2]
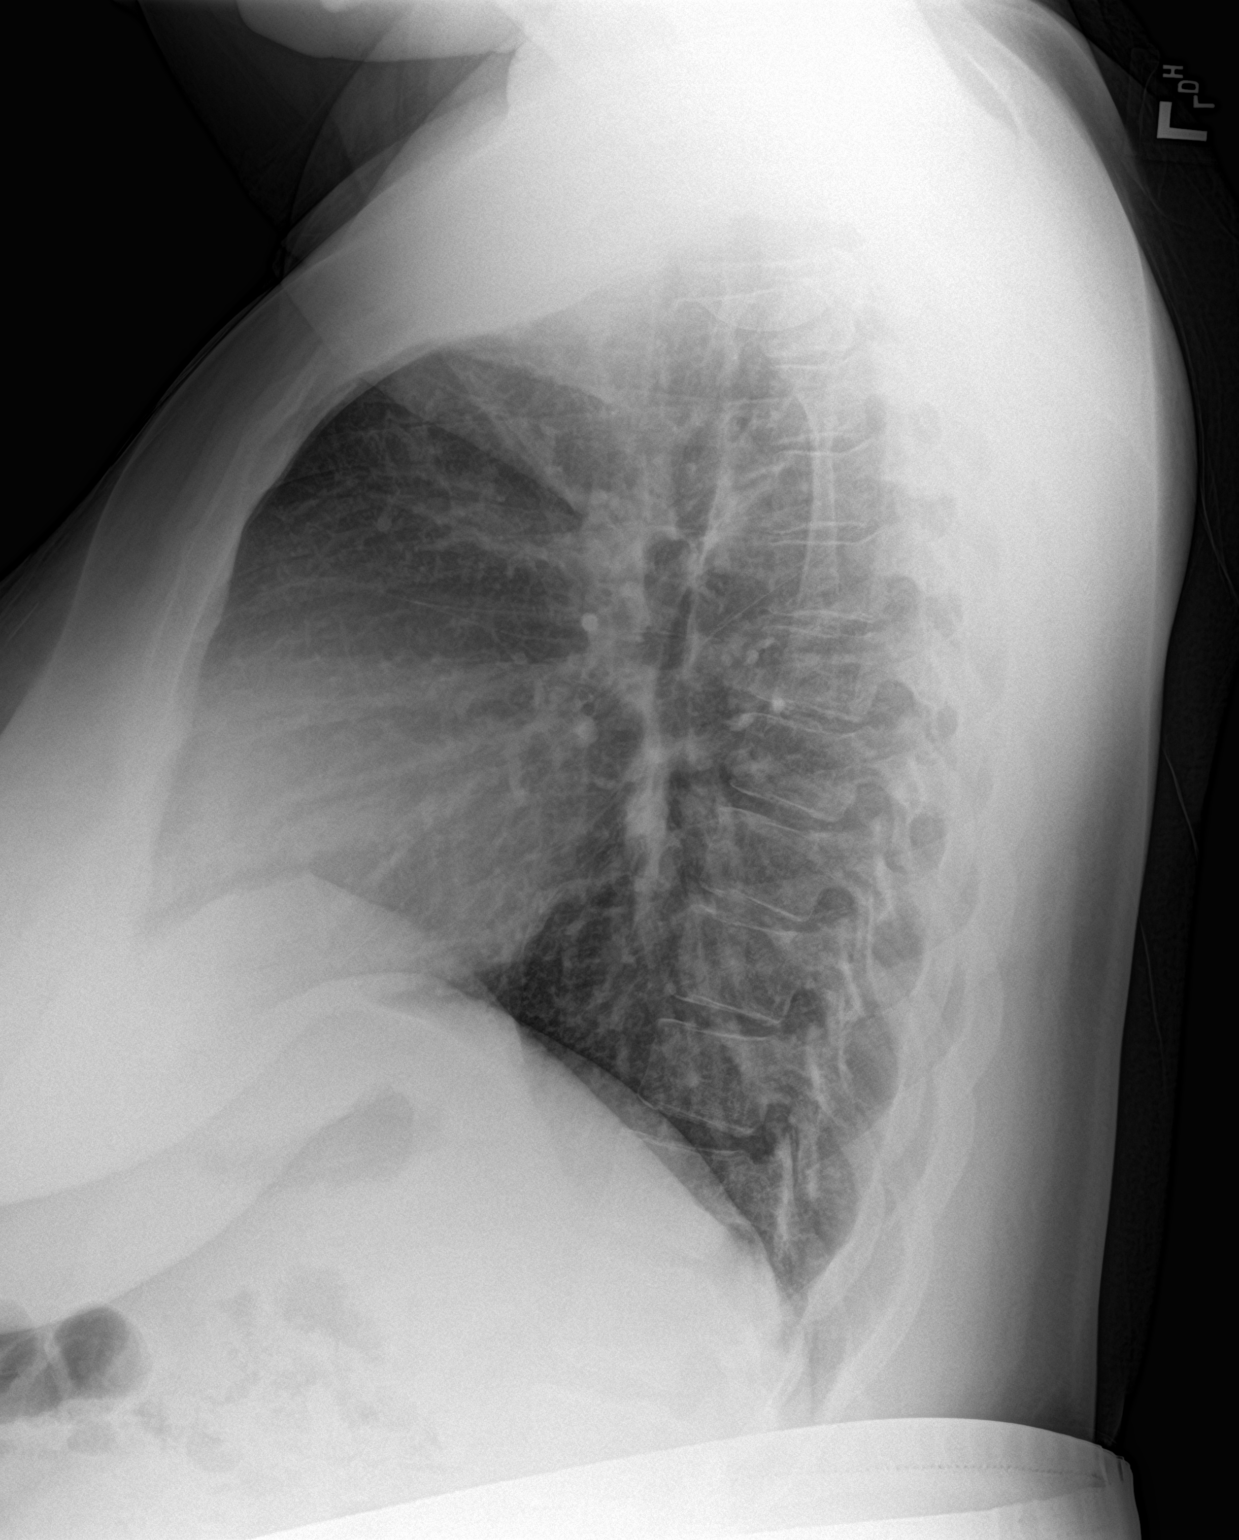

[2 of 2 positions shown; findings below may reference images not displayed]

FINDINGS: The heart size and mediastinal contours are within normal limits.
Both lungs are clear. No pleural effusion or pneumothorax. The
visualized skeletal structures are unremarkable.
IMPRESSION: No active cardiopulmonary disease.

## 2023-02-22 ENCOUNTER — Ambulatory Visit: Payer: Medicaid Other | Admitting: Podiatry

## 2023-03-03 ENCOUNTER — Ambulatory Visit: Payer: Medicaid Other | Admitting: Podiatry

## 2023-07-26 ENCOUNTER — Ambulatory Visit: Admitting: Podiatry

## 2023-11-10 ENCOUNTER — Other Ambulatory Visit (HOSPITAL_COMMUNITY): Payer: Self-pay

## 2023-11-30 ENCOUNTER — Ambulatory Visit: Payer: Self-pay | Attending: Cardiology | Admitting: Cardiology

## 2023-12-12 ENCOUNTER — Other Ambulatory Visit: Payer: Self-pay

## 2023-12-12 ENCOUNTER — Emergency Department
Admission: EM | Admit: 2023-12-12 | Discharge: 2023-12-12 | Disposition: A | Discharge status: Home / Self Care | Payer: Self-pay | Attending: Emergency Medicine | Admitting: Emergency Medicine

## 2023-12-12 ENCOUNTER — Emergency Department: Payer: Self-pay

## 2023-12-12 DIAGNOSIS — I10 Essential (primary) hypertension: Secondary | ICD-10-CM | POA: Insufficient documentation

## 2023-12-12 DIAGNOSIS — R079 Chest pain, unspecified: Secondary | ICD-10-CM | POA: Insufficient documentation

## 2023-12-12 DIAGNOSIS — E119 Type 2 diabetes mellitus without complications: Secondary | ICD-10-CM | POA: Insufficient documentation

## 2023-12-12 LAB — BASIC METABOLIC PANEL WITH GFR
Anion gap: 9 (ref 5–15)
BUN: 14 mg/dL (ref 6–20)
CO2: 26 mmol/L (ref 22–32)
Calcium: 9.1 mg/dL (ref 8.9–10.3)
Chloride: 103 mmol/L (ref 98–111)
Creatinine, Ser: 0.73 mg/dL (ref 0.44–1.00)
GFR, Estimated: >60 mL/min (ref >60)
Glucose, Bld: 86 mg/dL (ref 70–99)
Potassium: 3.7 mmol/L (ref 3.5–5.1)
Sodium: 138 mmol/L (ref 135–145)

## 2023-12-12 LAB — CBC
HCT: 37.1 % (ref 36.0–46.0)
Hemoglobin: 11.8 g/dL — ABNORMAL LOW (ref 12.0–15.0)
MCH: 27.4 pg (ref 26.0–34.0)
MCHC: 31.8 g/dL (ref 30.0–36.0)
MCV: 86.1 fL (ref 80.0–100.0)
Platelets: 241 K/uL (ref 150–400)
RBC: 4.31 MIL/uL (ref 3.87–5.11)
RDW: 14.7 % (ref 11.5–15.5)
WBC: 5.4 K/uL (ref 4.0–10.5)
nRBC: 0 % (ref 0.0–0.2)

## 2023-12-12 LAB — TROPONIN I (HIGH SENSITIVITY): Troponin I (High Sensitivity): 3 ng/L (ref <18)

## 2023-12-12 NOTE — Discharge Instructions (Signed)
 Your blood work, chest x-ray and EKG were all reassuring today.  Please follow-up with your cardiologist for the echocardiogram.  You can take 650 mg of Tylenol  every 6 hours and naproxen  every 12 hours as needed for pain. You can use ice, heat, muscle creams and other topical pain relievers as well.  Return to the emergency department with any worsening symptoms like increasing chest pain, difficulty breathing or pain with deep breaths.

## 2023-12-12 NOTE — ED Triage Notes (Addendum)
 Pt to ED via POV from home. Pt reports intermittent centralized CP with radiation to her back x1 wk. Pt reports today has a lump sensation in her throat. Denies SOB. Pt reports stopped smoking tobacco one month ago. Pt recently XIO patch and it was clear. Pt reports schedule for echo in a few weeks.

## 2023-12-12 NOTE — ED Notes (Signed)
 Patient given discharge instructions including importance of follow up with PCP with stated understanding. Patient stable and ambulatory with steady even gait on dispo.

## 2023-12-12 NOTE — ED Provider Notes (Signed)
 [facility] Provider Note    Event Date/Time   First MD Initiated Contact with Patient 12/12/23 1905     (approximate)   History   Chest Pain   HPI  Kelly Spence is a 33 y.o. female with PMH of borderline diabetes and hypertension who presents for evaluation of chest pain.  Patient reports 1 week of intermittent centralized chest pain.  She states she has also had some back pain and is not sure if the chest pain is coming from that.  No shortness of breath.  No worsening of pain with exertion.  No pain with deep breaths.  Patient states that today she had a lump sensation in her throat.  Recently evaluated by cardiology for palpitations.  She wore a Zio patch that showed some PVCs but was otherwise normal.  She is supposed to have an echocardiogram done in a few weeks.      Physical Exam   Triage Vital Signs: ED Triage Vitals  Encounter Vitals Group     BP 12/12/23 1453 (!) 148/96     Girls Systolic BP Percentile --      Girls Diastolic BP Percentile --      Boys Systolic BP Percentile --      Boys Diastolic BP Percentile --      Pulse Rate 12/12/23 1453 79     Resp 12/12/23 1453 18     Temp 12/12/23 1453 98.1 F (36.7 C)     Temp Source 12/12/23 1453 Oral     SpO2 12/12/23 1453 98 %     Weight --      Height --      Head Circumference --      Peak Flow --      Pain Score 12/12/23 1451 5     Pain Loc --      Pain Education --      Exclude from Growth Chart --     Most recent vital signs: Vitals:   12/12/23 1932 12/12/23 2030  BP: (!) 136/92 (!) 138/98  Pulse: 82 71  Resp: 18 18  Temp:  97.9 F (36.6 C)  SpO2: 100% 100%   General: Awake, no distress.  CV:  Good peripheral perfusion.  RRR. Resp:  Normal effort.  CTAB. Abd:  No distention.  Other:  Radial pulse 2+ and regular bilaterally.   ED Results / Procedures / Treatments   Labs (all labs ordered are listed, but only abnormal results are displayed) Labs Reviewed  CBC  - Abnormal; Notable for the following components:      Result Value   Hemoglobin 11.8 (*)    All other components within normal limits  BASIC METABOLIC PANEL WITH GFR  POC URINE PREG, ED  TROPONIN I (HIGH SENSITIVITY)     EKG  ED provider interpretation: Normal sinus rhythm, no ST changes.  Vent. rate 79 BPM PR interval 174 ms QRS duration 88 ms QT/QTcB 370/424 ms P-R-T axes 68 62 47   RADIOLOGY  Chest x-ray obtained, I interpreted the images as well as reviewed the radiologist report, which was negative for any acute cardiopulmonary abnormalities.  PROCEDURES:  Critical Care performed: No  Procedures   MEDICATIONS ORDERED IN ED: Medications - No data to display   IMPRESSION / MDM / ASSESSMENT AND PLAN / ED COURSE  I reviewed the triage vital signs and the nursing notes.  33 year old female presents for evaluation of chest pain.  Blood pressure is a little bit elevated but patient does have history of hypertension.  Vital signs stable otherwise.  Patient NAD on exam.  Differential diagnosis includes, but is not limited to, ACS, PE, pneumothorax, pneumonia, muscle strain, GERD, anxiety.  Patient's presentation is most consistent with acute complicated illness / injury requiring diagnostic workup.  CBC unremarkable aside from mild anemia.  BMP within normal limits.  Troponin is not elevated.  Chest x-ray is negative.  EKG shows normal sinus rhythm with no ST changes.  Physical exam and workup is reassuring.  Cannot use PERC criteria to rule out pulmonary embolism as patient does take oral birth control.  She is low risk based on Wells score.  Did consider whether she would need a D-dimer but feel that based on clinical presentation this is inconsistent with a pulmonary embolism.  Feel that the risk of false positive leading to unnecessary radiation outweighs possible benefit.  Did discuss signs and symptoms of pulmonary embolism for patient to  watch for and advised to return to the emergency department if she has any development of shortness of breath or pain with deep breaths.  Chest pain is reproducible and is worse with specific movements so I feel this is more consistent with musculoskeletal pain.  Advised her to continue taking the naproxen  as well as some Tylenol .  Did encourage her to follow-up with her cardiologist to have the echocardiogram done.  Patient voiced understanding, all questions were answered and she was stable at discharge.     FINAL CLINICAL IMPRESSION(S) / ED DIAGNOSES   Final diagnoses:  Chest pain, unspecified type     Rx / DC Orders   ED Discharge Orders     None        Note:  This document was prepared using Dragon voice recognition software and may include unintentional dictation errors.   Cleaster Tinnie LABOR, PA-C 12/12/23 2142    Floy Roberts, MD 12/12/23 2200
